# Patient Record
Sex: Female | Born: 1965 | Race: Black or African American | Hispanic: No | Marital: Single | Smoking: Former smoker
Health system: Southern US, Community
[De-identification: ages and names within clinical notes are randomized; demographics above are authoritative.]

## PROBLEM LIST (undated history)

## (undated) DIAGNOSIS — A549 Gonococcal infection, unspecified: Secondary | ICD-10-CM

## (undated) DIAGNOSIS — M1990 Unspecified osteoarthritis, unspecified site: Secondary | ICD-10-CM

## (undated) DIAGNOSIS — K219 Gastro-esophageal reflux disease without esophagitis: Secondary | ICD-10-CM

## (undated) DIAGNOSIS — I10 Essential (primary) hypertension: Secondary | ICD-10-CM

## (undated) DIAGNOSIS — A749 Chlamydial infection, unspecified: Secondary | ICD-10-CM

## (undated) DIAGNOSIS — G629 Polyneuropathy, unspecified: Secondary | ICD-10-CM

## (undated) DIAGNOSIS — N7011 Chronic salpingitis: Secondary | ICD-10-CM

## (undated) DIAGNOSIS — D219 Benign neoplasm of connective and other soft tissue, unspecified: Secondary | ICD-10-CM

## (undated) HISTORY — PX: COSMETIC SURGERY: SHX468

## (undated) HISTORY — PX: BACK SURGERY: SHX140

## (undated) HISTORY — PX: UNILATERAL SALPINGECTOMY: SHX6160

## (undated) HISTORY — PX: OOPHORECTOMY: SHX86

---

## 1998-12-02 ENCOUNTER — Inpatient Hospital Stay (HOSPITAL_COMMUNITY): Admission: AD | Admit: 1998-12-02 | Discharge: 1998-12-02 | Payer: Self-pay | Admitting: Obstetrics & Gynecology

## 1998-12-10 ENCOUNTER — Inpatient Hospital Stay (HOSPITAL_COMMUNITY): Admission: AD | Admit: 1998-12-10 | Discharge: 1998-12-10 | Payer: Self-pay | Admitting: Obstetrics & Gynecology

## 2004-12-25 ENCOUNTER — Emergency Department (HOSPITAL_COMMUNITY): Admission: EM | Admit: 2004-12-25 | Discharge: 2004-12-26 | Payer: Self-pay | Admitting: *Deleted

## 2005-05-24 ENCOUNTER — Emergency Department (HOSPITAL_COMMUNITY): Admission: EM | Admit: 2005-05-24 | Discharge: 2005-05-24 | Payer: Self-pay | Admitting: Emergency Medicine

## 2005-06-26 ENCOUNTER — Emergency Department (HOSPITAL_COMMUNITY): Admission: EM | Admit: 2005-06-26 | Discharge: 2005-06-26 | Payer: Self-pay | Admitting: Emergency Medicine

## 2005-08-15 ENCOUNTER — Emergency Department (HOSPITAL_COMMUNITY): Admission: EM | Admit: 2005-08-15 | Discharge: 2005-08-15 | Payer: Self-pay | Admitting: Emergency Medicine

## 2005-11-05 ENCOUNTER — Emergency Department (HOSPITAL_COMMUNITY): Admission: EM | Admit: 2005-11-05 | Discharge: 2005-11-05 | Payer: Self-pay | Admitting: Emergency Medicine

## 2007-04-12 ENCOUNTER — Emergency Department (HOSPITAL_COMMUNITY): Admission: EM | Admit: 2007-04-12 | Discharge: 2007-04-12 | Payer: Self-pay | Admitting: Emergency Medicine

## 2007-08-13 ENCOUNTER — Ambulatory Visit (HOSPITAL_COMMUNITY): Admission: RE | Admit: 2007-08-13 | Discharge: 2007-08-13 | Payer: Self-pay | Admitting: Family Medicine

## 2008-03-13 HISTORY — PX: HEMATOMA EVACUATION: SHX5118

## 2008-05-30 ENCOUNTER — Emergency Department (HOSPITAL_COMMUNITY): Admission: EM | Admit: 2008-05-30 | Discharge: 2008-05-30 | Payer: Self-pay | Admitting: Emergency Medicine

## 2008-08-21 ENCOUNTER — Emergency Department (HOSPITAL_COMMUNITY): Admission: EM | Admit: 2008-08-21 | Discharge: 2008-08-22 | Payer: Self-pay | Admitting: Emergency Medicine

## 2008-08-31 ENCOUNTER — Emergency Department (HOSPITAL_COMMUNITY): Admission: EM | Admit: 2008-08-31 | Discharge: 2008-08-31 | Payer: Self-pay | Admitting: Emergency Medicine

## 2008-09-22 ENCOUNTER — Emergency Department (HOSPITAL_COMMUNITY): Admission: EM | Admit: 2008-09-22 | Discharge: 2008-09-22 | Payer: Self-pay | Admitting: Emergency Medicine

## 2009-04-21 ENCOUNTER — Emergency Department (HOSPITAL_COMMUNITY): Admission: EM | Admit: 2009-04-21 | Discharge: 2009-04-21 | Payer: Self-pay | Admitting: Emergency Medicine

## 2009-04-28 ENCOUNTER — Emergency Department (HOSPITAL_COMMUNITY): Admission: EM | Admit: 2009-04-28 | Discharge: 2009-04-28 | Payer: Self-pay | Admitting: Emergency Medicine

## 2009-07-13 ENCOUNTER — Emergency Department (HOSPITAL_COMMUNITY): Admission: EM | Admit: 2009-07-13 | Discharge: 2009-07-13 | Payer: Self-pay | Admitting: Emergency Medicine

## 2009-07-23 ENCOUNTER — Ambulatory Visit (HOSPITAL_COMMUNITY): Admission: RE | Admit: 2009-07-23 | Discharge: 2009-07-23 | Payer: Self-pay | Admitting: Family Medicine

## 2009-11-29 ENCOUNTER — Emergency Department (HOSPITAL_COMMUNITY): Admission: EM | Admit: 2009-11-29 | Discharge: 2009-11-29 | Payer: Self-pay | Admitting: Emergency Medicine

## 2010-01-18 ENCOUNTER — Emergency Department (HOSPITAL_COMMUNITY)
Admission: EM | Admit: 2010-01-18 | Discharge: 2010-01-18 | Payer: Self-pay | Source: Home / Self Care | Admitting: Emergency Medicine

## 2010-01-25 ENCOUNTER — Ambulatory Visit (HOSPITAL_COMMUNITY)
Admission: RE | Admit: 2010-01-25 | Discharge: 2010-01-25 | Payer: Self-pay | Source: Home / Self Care | Admitting: Family Medicine

## 2010-06-20 LAB — GC/CHLAMYDIA PROBE AMP, GENITAL
Chlamydia, DNA Probe: NEGATIVE
GC Probe Amp, Genital: NEGATIVE

## 2010-06-20 LAB — URINALYSIS, ROUTINE W REFLEX MICROSCOPIC
Bilirubin Urine: NEGATIVE
Ketones, ur: NEGATIVE mg/dL
Leukocytes, UA: NEGATIVE
Nitrite: NEGATIVE
Protein, ur: NEGATIVE mg/dL

## 2010-06-20 LAB — DIFFERENTIAL
Basophils Absolute: 0 10*3/uL (ref 0.0–0.1)
Eosinophils Relative: 1 % (ref 0–5)
Lymphocytes Relative: 22 % (ref 12–46)
Lymphs Abs: 1.9 10*3/uL (ref 0.7–4.0)
Neutro Abs: 6.2 10*3/uL (ref 1.7–7.7)
Neutrophils Relative %: 70 % (ref 43–77)

## 2010-06-20 LAB — CBC
HCT: 35.4 % — ABNORMAL LOW (ref 36.0–46.0)
Platelets: 208 10*3/uL (ref 150–400)
RDW: 13.3 % (ref 11.5–15.5)
WBC: 8.9 10*3/uL (ref 4.0–10.5)

## 2010-06-20 LAB — BASIC METABOLIC PANEL
BUN: 8 mg/dL (ref 6–23)
Calcium: 8.8 mg/dL (ref 8.4–10.5)
GFR calc non Af Amer: >60 mL/min (ref >60)
Glucose, Bld: 93 mg/dL (ref 70–99)
Potassium: 4.1 mEq/L (ref 3.5–5.1)
Sodium: 133 mEq/L — ABNORMAL LOW (ref 135–145)

## 2010-06-20 LAB — WET PREP, GENITAL
Trich, Wet Prep: NONE SEEN
WBC, Wet Prep HPF POC: NONE SEEN

## 2010-06-20 LAB — PREGNANCY, URINE: Preg Test, Ur: NEGATIVE

## 2010-06-23 LAB — URINALYSIS, ROUTINE W REFLEX MICROSCOPIC
Bilirubin Urine: NEGATIVE
Glucose, UA: NEGATIVE mg/dL
Ketones, ur: NEGATIVE mg/dL
Nitrite: NEGATIVE
pH: 5.5 (ref 5.0–8.0)

## 2010-06-23 LAB — GC/CHLAMYDIA PROBE AMP, GENITAL
Chlamydia, DNA Probe: NEGATIVE
GC Probe Amp, Genital: NEGATIVE

## 2010-06-23 LAB — URINE MICROSCOPIC-ADD ON

## 2010-06-23 LAB — PREGNANCY, URINE: Preg Test, Ur: NEGATIVE

## 2010-06-23 LAB — WET PREP, GENITAL: Yeast Wet Prep HPF POC: NONE SEEN

## 2010-11-02 ENCOUNTER — Other Ambulatory Visit: Payer: Self-pay | Admitting: Obstetrics and Gynecology

## 2010-11-02 DIAGNOSIS — Z139 Encounter for screening, unspecified: Secondary | ICD-10-CM

## 2010-11-08 ENCOUNTER — Ambulatory Visit (HOSPITAL_COMMUNITY)
Admission: RE | Admit: 2010-11-08 | Discharge: 2010-11-08 | Disposition: A | Discharge status: Home / Self Care | Payer: Self-pay | Source: Ambulatory Visit | Attending: Obstetrics and Gynecology | Admitting: Obstetrics and Gynecology

## 2010-11-08 DIAGNOSIS — Z139 Encounter for screening, unspecified: Secondary | ICD-10-CM

## 2011-01-12 ENCOUNTER — Encounter: Payer: Self-pay | Admitting: *Deleted

## 2011-01-12 ENCOUNTER — Emergency Department (HOSPITAL_COMMUNITY)
Admission: EM | Admit: 2011-01-12 | Discharge: 2011-01-13 | Disposition: A | Discharge status: Home / Self Care | Payer: Self-pay | Attending: Emergency Medicine | Admitting: Emergency Medicine

## 2011-01-12 DIAGNOSIS — A599 Trichomoniasis, unspecified: Secondary | ICD-10-CM | POA: Insufficient documentation

## 2011-01-12 DIAGNOSIS — Z87891 Personal history of nicotine dependence: Secondary | ICD-10-CM | POA: Insufficient documentation

## 2011-01-12 DIAGNOSIS — R300 Dysuria: Secondary | ICD-10-CM | POA: Insufficient documentation

## 2011-01-12 DIAGNOSIS — N949 Unspecified condition associated with female genital organs and menstrual cycle: Secondary | ICD-10-CM | POA: Insufficient documentation

## 2011-01-12 DIAGNOSIS — K219 Gastro-esophageal reflux disease without esophagitis: Secondary | ICD-10-CM | POA: Insufficient documentation

## 2011-01-12 DIAGNOSIS — I10 Essential (primary) hypertension: Secondary | ICD-10-CM | POA: Insufficient documentation

## 2011-01-12 DIAGNOSIS — R109 Unspecified abdominal pain: Secondary | ICD-10-CM | POA: Insufficient documentation

## 2011-01-12 HISTORY — DX: Gastro-esophageal reflux disease without esophagitis: K21.9

## 2011-01-12 HISTORY — DX: Essential (primary) hypertension: I10

## 2011-01-12 NOTE — ED Notes (Signed)
Pt reports dysuria and polyuria with asso vag d/c

## 2011-01-13 ENCOUNTER — Other Ambulatory Visit (HOSPITAL_COMMUNITY): Payer: Self-pay

## 2011-01-13 ENCOUNTER — Ambulatory Visit (HOSPITAL_COMMUNITY): Payer: Self-pay

## 2011-01-13 LAB — WET PREP, GENITAL: Yeast Wet Prep HPF POC: NONE SEEN

## 2011-01-13 LAB — URINALYSIS, ROUTINE W REFLEX MICROSCOPIC
Bilirubin Urine: NEGATIVE
Glucose, UA: NEGATIVE mg/dL
Hgb urine dipstick: NEGATIVE
Specific Gravity, Urine: >1.03 — ABNORMAL HIGH (ref 1.005–1.030)
pH: 6 (ref 5.0–8.0)

## 2011-01-13 MED ORDER — AZITHROMYCIN 250 MG PO TABS
1000.0000 mg | ORAL_TABLET | Freq: Once | ORAL | Status: AC
  Administered 2011-01-13: 1000 mg via ORAL
  Filled 2011-01-13: qty 4

## 2011-01-13 MED ORDER — CEFTRIAXONE SODIUM 250 MG IJ SOLR
250.0000 mg | Freq: Once | INTRAMUSCULAR | Status: AC
  Administered 2011-01-13: 250 mg via INTRAMUSCULAR
  Filled 2011-01-13: qty 250

## 2011-01-13 MED ORDER — METRONIDAZOLE 500 MG PO TABS: 500.0000 mg | ORAL_TABLET | Freq: Two times a day (BID) | ORAL | Status: AC

## 2011-01-13 NOTE — ED Provider Notes (Signed)
History     CSN: [id number] Arrival date & time: 01/12/2011 11:23 PM   First MD Initiated Contact with Patient 01/12/11 2355      Chief Complaint  Patient presents with  . Dysuria  . Flank Pain    (Consider location/radiation/quality/duration/timing/severity/associated sxs/prior treatment) Patient is a 45 y.o. female presenting with dysuria and flank pain. The history is provided by the patient.  Dysuria  This is a new problem. The current episode started 2 days ago. The problem occurs every urination. The problem has not changed since onset.The quality of the pain is described as burning. The pain is mild. There has been no fever. She is sexually active. Associated symptoms include chills and discharge. Pertinent negatives include no vomiting, no frequency, no hematuria and no urgency. She has tried nothing for the symptoms. Her past medical history does not include urological procedure. Past medical history comments: She was prescribed a water pill makes her urinate frequently.  Flank Pain Pertinent negatives include no chest pain, no headaches and no shortness of breath.   her flank pain is really left lower pelvic pain, sharp in nature and persisted over last 4 days. It is nonradiating and mild in severity. No aggravating or alleviating factors. No history of same.  Past Medical History  Diagnosis Date  . Hypertension   . Acid reflux     Past Surgical History  Procedure Date  . Oophorectomy     No family history on file.  History  Substance Use Topics  . Smoking status: Former Games developer  . Smokeless tobacco: Not on file  . Alcohol Use: No    OB History    Grav Para Term Preterm Abortions TAB SAB Ect Mult Living                  Review of Systems  Constitutional: Positive for chills. Negative for fever.  HENT: Negative for neck pain and neck stiffness.   Eyes: Negative for pain.  Respiratory: Negative for shortness of breath.   Cardiovascular: Negative for chest  pain.  Gastrointestinal: Negative for vomiting and diarrhea.  Genitourinary: Positive for dysuria. Negative for urgency, frequency and hematuria.  Musculoskeletal: Negative for back pain.  Skin: Negative for rash.  Neurological: Negative for headaches.  All other systems reviewed and are negative.    Allergies  Bee  Home Medications   Current Outpatient Rx  Name Route Sig Dispense Refill  . GABAPENTIN 400 MG PO CAPS Oral Take 400 mg by mouth at bedtime.      Marland Kitchen LISINOPRIL-HYDROCHLOROTHIAZIDE 20-12.5 MG PO TABS Oral Take 1 tablet by mouth daily.      Marland Kitchen OMEPRAZOLE 20 MG PO CPDR Oral Take 20 mg by mouth daily.        BP 128/67  Pulse 66  Temp(Src) 98.4 F (36.9 C) (Oral)  Resp 16  Ht 5\' 6"  (1.676 m)  Wt 300 lb (136.079 kg)  BMI 48.42 kg/m2  SpO2 99%  LMP 12/21/2010  Physical Exam  Constitutional: She is oriented to person, place, and time. She appears well-developed and well-nourished.  HENT:  Head: Normocephalic and atraumatic.  Eyes: Conjunctivae and EOM are normal. Pupils are equal, round, and reactive to light.  Neck: Trachea normal. Neck supple. No thyromegaly present.  Cardiovascular: Normal rate, regular rhythm, S1 normal, S2 normal and normal pulses.     No systolic murmur is present   No diastolic murmur is present  Pulses:      Radial pulses are 2+ on  the right side, and 2+ on the left side.  Pulmonary/Chest: Effort normal and breath sounds normal. She has no wheezes. She has no rhonchi. She has no rales. She exhibits no tenderness.  Abdominal: Soft. Normal appearance and bowel sounds are normal. There is no tenderness. There is no CVA tenderness and negative Murphy's sign.  Genitourinary:       Normal external genitalia. Moderate white vaginal discharge. No cervical motion tenderness. Mild left adnexal tenderness. No rash or lesions  Musculoskeletal:       BLE:s Calves nontender, no cords or erythema, negative Homans sign  Neurological: She is alert and  oriented to person, place, and time. She has normal strength. No cranial nerve deficit or sensory deficit. GCS eye subscore is 4. GCS verbal subscore is 5. GCS motor subscore is 6.  Skin: Skin is warm and dry. No rash noted. She is not diaphoretic.  Psychiatric: Her speech is normal.       Cooperative and appropriate    ED Course  Procedures (including critical care time)  Results for orders placed during the hospital encounter of 01/12/11  URINALYSIS, ROUTINE W REFLEX MICROSCOPIC      Component Value Range   Color, Urine AMBER (*) YELLOW    Appearance CLEAR  CLEAR    Specific Gravity, Urine >1.030 (*) 1.005 - 1.030    pH 6.0  5.0 - 8.0    Glucose, UA NEGATIVE  NEGATIVE (mg/dL)   Hgb urine dipstick NEGATIVE  NEGATIVE    Bilirubin Urine NEGATIVE  NEGATIVE    Ketones, ur TRACE (*) NEGATIVE (mg/dL)   Protein, ur NEGATIVE  NEGATIVE (mg/dL)   Urobilinogen, UA 0.2  0.0 - 1.0 (mg/dL)   Nitrite NEGATIVE  NEGATIVE    Leukocytes, UA NEGATIVE  NEGATIVE   PREGNANCY, URINE      Component Value Range   Preg Test, Ur NEGATIVE    WET PREP, GENITAL      Component Value Range   Yeast, Wet Prep NONE SEEN  NONE SEEN    Trich, Wet Prep FEW (*) NONE SEEN    Clue Cells, Wet Prep MODERATE (*) NONE SEEN    WBC, Wet Prep HPF POC MODERATE (*) NONE SEEN   POCT PREGNANCY, URINE      Component Value Range   Preg Test, Ur NEGATIVE          MDM   Vaginal discharge and left pelvic pain. Patient treated for trichomoniasis and prophylaxis for other STDs with GC chlamydia pending. Given left pelvic pain and no ultrasound available tonight, patient scheduled for ultrasound in the morning. No clinical TOA or torsion/ or indication for transfer for emergent ultrasound at this time.  Patient declines pain meds in the ER is stable for discharge home.         Sunnie Nielsen, MD 01/13/11 [phone]

## 2011-01-13 NOTE — ED Notes (Signed)
Pt left the er stating no needs 

## 2011-01-14 LAB — GC/CHLAMYDIA PROBE AMP, GENITAL
Chlamydia, DNA Probe: NEGATIVE
GC Probe Amp, Genital: NEGATIVE

## 2011-02-07 ENCOUNTER — Encounter (HOSPITAL_COMMUNITY): Payer: Self-pay | Admitting: Emergency Medicine

## 2011-02-07 ENCOUNTER — Emergency Department (HOSPITAL_COMMUNITY)
Admission: EM | Admit: 2011-02-07 | Discharge: 2011-02-07 | Disposition: A | Discharge status: Home / Self Care | Payer: Self-pay | Attending: Emergency Medicine | Admitting: Emergency Medicine

## 2011-02-07 ENCOUNTER — Emergency Department (HOSPITAL_COMMUNITY): Payer: Self-pay

## 2011-02-07 DIAGNOSIS — G589 Mononeuropathy, unspecified: Secondary | ICD-10-CM | POA: Insufficient documentation

## 2011-02-07 DIAGNOSIS — S91309A Unspecified open wound, unspecified foot, initial encounter: Secondary | ICD-10-CM | POA: Insufficient documentation

## 2011-02-07 DIAGNOSIS — W268XXA Contact with other sharp object(s), not elsewhere classified, initial encounter: Secondary | ICD-10-CM | POA: Insufficient documentation

## 2011-02-07 DIAGNOSIS — S91332A Puncture wound without foreign body, left foot, initial encounter: Secondary | ICD-10-CM

## 2011-02-07 DIAGNOSIS — Z23 Encounter for immunization: Secondary | ICD-10-CM | POA: Insufficient documentation

## 2011-02-07 DIAGNOSIS — I10 Essential (primary) hypertension: Secondary | ICD-10-CM | POA: Insufficient documentation

## 2011-02-07 DIAGNOSIS — Z87891 Personal history of nicotine dependence: Secondary | ICD-10-CM | POA: Insufficient documentation

## 2011-02-07 DIAGNOSIS — K219 Gastro-esophageal reflux disease without esophagitis: Secondary | ICD-10-CM | POA: Insufficient documentation

## 2011-02-07 HISTORY — DX: Polyneuropathy, unspecified: G62.9

## 2011-02-07 MED ORDER — DOXYCYCLINE HYCLATE 100 MG PO TABS
100.0000 mg | ORAL_TABLET | Freq: Once | ORAL | Status: AC
  Administered 2011-02-07: 100 mg via ORAL
  Filled 2011-02-07: qty 1

## 2011-02-07 MED ORDER — TETANUS-DIPHTH-ACELL PERTUSSIS 5-2.5-18.5 LF-MCG/0.5 IM SUSP
0.5000 mL | Freq: Once | INTRAMUSCULAR | Status: AC
  Administered 2011-02-07: 0.5 mL via INTRAMUSCULAR
  Filled 2011-02-07 (×3): qty 0.5

## 2011-02-07 MED ORDER — DOXYCYCLINE HYCLATE 100 MG PO CAPS: 100.0000 mg | ORAL_CAPSULE | Freq: Two times a day (BID) | ORAL | Status: AC

## 2011-02-07 NOTE — ED Notes (Signed)
Patient c/o left foot pain. Per patient stepped on broad with nail. Small scabbed puncture wound noted on bottom of foot. No active bleeding. Patient unsure of last tetanus shot.

## 2011-02-07 NOTE — ED Provider Notes (Signed)
History     CSN: [id number] Arrival date & time: 02/07/2011 10:15 AM   First MD Initiated Contact with Patient 02/07/11 1008      Chief Complaint  Patient presents with  . Foot Pain    (Consider location/radiation/quality/duration/timing/severity/associated sxs/prior treatment) HPI Comments: Pt was barefooted at home last PM.  She was building a fire with some scrap wood that had some ocassional protruding nails.  She stepped on one.  Patient is a 45 y.o. female presenting with lower extremity pain. The history is provided by the patient. No language interpreter was used.  Foot Pain This is a new problem. The current episode started yesterday. The problem occurs constantly. The problem has been gradually worsening. Pertinent negatives include no fever. Exacerbated by: palpation. She has tried nothing for the symptoms.    Past Medical History  Diagnosis Date  . Hypertension   . Acid reflux   . Neuropathy     Past Surgical History  Procedure Date  . Oophorectomy     Family History  Problem Relation Age of Onset  . Hypertension Mother   . Diabetes Mother   . Hypertension Father     History  Substance Use Topics  . Smoking status: Former Smoker    Quit date: 02/06/2001  . Smokeless tobacco: Never Used  . Alcohol Use: No    OB History    Grav Para Term Preterm Abortions TAB SAB Ect Mult Living            0      Review of Systems  Constitutional: Negative for fever.  Skin: Positive for wound.  All other systems reviewed and are negative.    Allergies  Bee  Home Medications   Current Outpatient Rx  Name Route Sig Dispense Refill  . GABAPENTIN 400 MG PO CAPS Oral Take 400 mg by mouth at bedtime.      Marland Kitchen LISINOPRIL-HYDROCHLOROTHIAZIDE 20-12.5 MG PO TABS Oral Take 1 tablet by mouth daily.      Marland Kitchen OMEPRAZOLE 20 MG PO CPDR Oral Take 20 mg by mouth daily.        BP 132/83  Pulse 66  Temp(Src) 98.5 F (36.9 C) (Oral)  Resp 15  Ht 5\' 5"  (1.651 m)  Wt 276  lb 8 oz (125.42 kg)  BMI 46.01 kg/m2  SpO2 99%  LMP 12/21/2010  Physical Exam  Nursing note and vitals reviewed. Constitutional: She is oriented to person, place, and time. She appears well-developed and well-nourished. No distress.  HENT:  Head: Normocephalic and atraumatic.  Eyes: EOM are normal.  Neck: Normal range of motion.  Cardiovascular: Normal rate, regular rhythm and normal heart sounds.   Pulmonary/Chest: Effort normal and breath sounds normal.  Abdominal: Soft. She exhibits no distension. There is no tenderness.  Musculoskeletal: Normal range of motion. She exhibits tenderness.       Left foot: She exhibits tenderness. She exhibits normal range of motion, no bony tenderness, no swelling, normal capillary refill, no crepitus, no deformity and no laceration.       Feet:       Closed puncture wound to L mid-arch region.  Neurological: She is alert and oriented to person, place, and time.  Skin: Skin is warm and dry.  Psychiatric: She has a normal mood and affect. Judgment normal.    ED Course  Procedures (including critical care time)  Labs Reviewed - No data to display No results found.   No diagnosis found.    MDM  Worthy Rancher, PA 02/07/11 1040

## 2011-02-07 NOTE — ED Provider Notes (Signed)
Medical screening examination/treatment/procedure(s) were conducted as a shared visit with non-physician practitioner(s) and myself.  I personally evaluated the patient during the encounter  Pt seen and examined.  Still with erythema of the foot however non toxic.  No significant warmth or edema.  No lymphangitic  streaking.  Do not feel admission is necessary at this time.    Celene Kras, MD 02/07/11 [phone]

## 2011-06-20 ENCOUNTER — Encounter (HOSPITAL_COMMUNITY): Payer: Self-pay | Admitting: *Deleted

## 2011-06-20 ENCOUNTER — Emergency Department (HOSPITAL_COMMUNITY)
Admission: EM | Admit: 2011-06-20 | Discharge: 2011-06-21 | Disposition: A | Discharge status: Home / Self Care | Payer: Self-pay | Attending: Emergency Medicine | Admitting: Emergency Medicine

## 2011-06-20 DIAGNOSIS — R05 Cough: Secondary | ICD-10-CM | POA: Insufficient documentation

## 2011-06-20 DIAGNOSIS — J3489 Other specified disorders of nose and nasal sinuses: Secondary | ICD-10-CM | POA: Insufficient documentation

## 2011-06-20 DIAGNOSIS — Z87891 Personal history of nicotine dependence: Secondary | ICD-10-CM | POA: Insufficient documentation

## 2011-06-20 DIAGNOSIS — I10 Essential (primary) hypertension: Secondary | ICD-10-CM | POA: Insufficient documentation

## 2011-06-20 DIAGNOSIS — R059 Cough, unspecified: Secondary | ICD-10-CM | POA: Insufficient documentation

## 2011-06-20 DIAGNOSIS — R6883 Chills (without fever): Secondary | ICD-10-CM | POA: Insufficient documentation

## 2011-06-20 NOTE — ED Notes (Signed)
Cough and congestion since Sat. Before last per pt, today had chills

## 2011-06-20 NOTE — ED Notes (Signed)
Lung sounds clear, no wheezing at present. Pt coughing up yellow tinged sputum now.

## 2011-06-21 ENCOUNTER — Emergency Department (HOSPITAL_COMMUNITY): Payer: Self-pay

## 2011-06-21 MED ORDER — SULFAMETHOXAZOLE-TMP DS 800-160 MG PO TABS
1.0000 | ORAL_TABLET | Freq: Once | ORAL | Status: AC
  Administered 2011-06-21: 1 via ORAL
  Filled 2011-06-21: qty 1

## 2011-06-21 MED ORDER — SULFAMETHOXAZOLE-TRIMETHOPRIM 800-160 MG PO TABS: 1.0000 | ORAL_TABLET | Freq: Two times a day (BID) | ORAL | Status: AC

## 2011-06-21 MED ORDER — ALBUTEROL SULFATE (5 MG/ML) 0.5% IN NEBU
2.5000 mg | INHALATION_SOLUTION | Freq: Once | RESPIRATORY_TRACT | Status: AC
  Administered 2011-06-21: 2.5 mg via RESPIRATORY_TRACT
  Filled 2011-06-21: qty 0.5

## 2011-06-21 MED ORDER — ALBUTEROL SULFATE HFA 108 (90 BASE) MCG/ACT IN AERS: 1.0000 | INHALATION_SPRAY | Freq: Four times a day (QID) | RESPIRATORY_TRACT | Status: DC | PRN

## 2011-06-21 MED ORDER — SULFAMETHOXAZOLE-TRIMETHOPRIM 200-40 MG/5ML PO SUSP: 20.0000 mL | Freq: Once | ORAL | Status: DC

## 2011-06-21 NOTE — Discharge Instructions (Signed)
Drink lots of fluids. Use tylenol or ibuprofen for aches, pains and any fevers. Take allof the antibiotic. Use the inhaler for wheezing.   Cough, Adult  A cough is a reflex. It helps you clear your throat and airways. A cough can help heal your body. A cough can last 2 or 3 weeks (acute) or may last more than 8 weeks (chronic). Some common causes of a cough can include an infection, allergy, or a cold. HOME CARE  Only take medicine as told by your doctor.   If given, take your medicines (antibiotics) as told. Finish them even if you start to feel better.   Use a cold steam vaporizer or humidier in your home. This can help loosen thick spit (secretions).   Sleep so you are almost sitting up (semi-upright). Use pillows to do this. This helps reduce coughing.   Rest as needed.   Stop smoking if you smoke.  GET HELP RIGHT AWAY IF:  You have yellowish-white fluid (pus) in your thick spit.   Your cough gets worse.   Your medicine does not reduce coughing, and you are losing sleep.   You cough up blood.   You have trouble breathing.   Your pain gets worse and medicine does not help.   You have a fever.  MAKE SURE YOU:   Understand these instructions.   Will watch your condition.   Will get help right away if you are not doing well or get worse.  Document Released: 11/10/2010 Document Revised: 02/16/2011 Document Reviewed: 11/10/2010 [facility] Patient Information 2012 [place], [place].

## 2011-06-21 NOTE — ED Notes (Signed)
RT at bedside.

## 2011-06-28 NOTE — ED Provider Notes (Signed)
History     CSN: [id number]  Arrival date & time 06/20/11  7829   First MD Initiated Contact with Patient 06/20/11 2349      Chief Complaint  Patient presents with  . Cough  . Nasal Congestion  . Chills    (Consider location/radiation/quality/duration/timing/severity/associated sxs/prior treatment) HPI Diana Blevins is a 46 y.o. female who presents to the Emergency Department complaining of  Cough, congestion, chills, wheezing since Saturday. Use OTC cough medicine without relief.Cough is associated with discomfort to the chest. Denies fever, shortness of breath. Past Medical History  Diagnosis Date  . Hypertension   . Acid reflux   . Neuropathy     Past Surgical History  Procedure Date  . Oophorectomy     Family History  Problem Relation Age of Onset  . Hypertension Mother   . Diabetes Mother   . Hypertension Father     History  Substance Use Topics  . Smoking status: Former Smoker    Quit date: 02/06/2001  . Smokeless tobacco: Never Used  . Alcohol Use: No    OB History    Grav Para Term Preterm Abortions TAB SAB Ect Mult Living            0      Review of Systems  Constitutional: Positive for chills. Negative for fever.       10 Systems reviewed and are negative for acute change except as noted in the HPI.  HENT: Positive for congestion.   Eyes: Negative for discharge and redness.  Respiratory: Positive for cough. Negative for shortness of breath.   Cardiovascular: Negative for chest pain.  Gastrointestinal: Negative for vomiting and abdominal pain.  Musculoskeletal: Negative for back pain.  Skin: Negative for rash.  Neurological: Negative for syncope, numbness and headaches.  Psychiatric/Behavioral:       No behavior change.    Allergies  Bee; Dust mite extract; and Latex  Home Medications   Current Outpatient Rx  Name Route Sig Dispense Refill  . LISINOPRIL-HYDROCHLOROTHIAZIDE 20-12.5 MG PO TABS Oral Take 1 tablet by mouth daily.       Marland Kitchen LORATADINE 10 MG PO TABS Oral Take 10 mg by mouth daily as needed. FOR ALLERGIES    . ALBUTEROL SULFATE HFA 108 (90 BASE) MCG/ACT IN AERS Inhalation Inhale 1-2 puffs into the lungs every 6 (six) hours as needed for wheezing. 1 Inhaler 0  . SULFAMETHOXAZOLE-TRIMETHOPRIM 800-160 MG PO TABS Oral Take 1 tablet by mouth 2 (two) times daily. 14 tablet 0    BP 126/73  Pulse 78  Temp(Src) 98.9 F (37.2 C) (Oral)  Resp 20  Ht 5\' 5"  (1.651 m)  Wt 276 lb (125.193 kg)  BMI 45.93 kg/m2  SpO2 98%  LMP 06/11/2011  Physical Exam  Nursing note and vitals reviewed. Constitutional:       Awake, alert, nontoxic appearance.  HENT:  Head: Atraumatic.  Eyes: Right eye exhibits no discharge. Left eye exhibits no discharge.  Neck: Neck supple.  Pulmonary/Chest: Effort normal and breath sounds normal. No respiratory distress. She has no wheezes. She exhibits no tenderness.       coughing  Abdominal: Soft. There is no tenderness. There is no rebound.  Musculoskeletal: She exhibits no tenderness.       Baseline ROM, no obvious new focal weakness.  Neurological: She is alert.       Mental status and motor strength appears baseline for patient and situation.  Skin: No rash noted.  Psychiatric: She has  a normal mood and affect.    ED Course  Procedures (including critical care time)  Dg Chest 2 View  06/21/2011  *RADIOLOGY REPORT*  Clinical Data: Cough and congestion with chills.  High blood pressure.  Nonsmoker.  CHEST - 2 VIEW  Comparison: 04/28/2009.  Findings: No infiltrate, congestive heart failure or pneumothorax. Question tiny granuloma left upper lobe.  Appearance unchanged.  Heart size within normal limits.  IMPRESSION: No acute abnormality.  Original Report Authenticated By: Fuller Canada, M.D.   1. Cough       MDM  Patient with cough and congestion since Saturday. Given nebulizer treatment with improvement. Xray was unremarkable. Initiated antibiotic threapy.  Pt feels improved after  observation and/or treatment in ED.Pt stable in ED with no significant deterioration in condition.The patient appears reasonably screened and/or stabilized for discharge and I doubt any other medical condition or other [facility] requiring further screening, evaluation, or treatment in the ED at this time prior to discharge.  MDM Reviewed: nursing note and vitals Interpretation: x-ray           Nicoletta Dress. Colon Branch, MD 06/28/11 1046

## 2011-09-13 ENCOUNTER — Emergency Department (HOSPITAL_COMMUNITY)
Admission: EM | Admit: 2011-09-13 | Discharge: 2011-09-13 | Disposition: A | Discharge status: Home / Self Care | Payer: Self-pay | Attending: Emergency Medicine | Admitting: Emergency Medicine

## 2011-09-13 ENCOUNTER — Encounter (HOSPITAL_COMMUNITY): Payer: Self-pay | Admitting: *Deleted

## 2011-09-13 DIAGNOSIS — I10 Essential (primary) hypertension: Secondary | ICD-10-CM | POA: Insufficient documentation

## 2011-09-13 DIAGNOSIS — L02419 Cutaneous abscess of limb, unspecified: Secondary | ICD-10-CM | POA: Insufficient documentation

## 2011-09-13 DIAGNOSIS — L0390 Cellulitis, unspecified: Secondary | ICD-10-CM

## 2011-09-13 DIAGNOSIS — K219 Gastro-esophageal reflux disease without esophagitis: Secondary | ICD-10-CM | POA: Insufficient documentation

## 2011-09-13 DIAGNOSIS — Z87891 Personal history of nicotine dependence: Secondary | ICD-10-CM | POA: Insufficient documentation

## 2011-09-13 MED ORDER — IBUPROFEN 800 MG PO TABS
800.0000 mg | ORAL_TABLET | Freq: Once | ORAL | Status: AC
  Administered 2011-09-13: 800 mg via ORAL
  Filled 2011-09-13: qty 1

## 2011-09-13 MED ORDER — HYDROCODONE-ACETAMINOPHEN 5-325 MG PO TABS: ORAL_TABLET | ORAL | Status: AC

## 2011-09-13 MED ORDER — SULFAMETHOXAZOLE-TMP DS 800-160 MG PO TABS
1.0000 | ORAL_TABLET | Freq: Once | ORAL | Status: AC
  Administered 2011-09-13: 1 via ORAL
  Filled 2011-09-13: qty 1

## 2011-09-13 MED ORDER — SULFAMETHOXAZOLE-TRIMETHOPRIM 800-160 MG PO TABS: 1.0000 | ORAL_TABLET | Freq: Two times a day (BID) | ORAL | Status: AC

## 2011-09-13 NOTE — ED Notes (Signed)
Pain, redness, swelling to rt knee. Thinks insect bit her on Sunday,

## 2011-09-13 NOTE — ED Provider Notes (Signed)
History     CSN: [id number]  Arrival date & time 09/13/11  1742   First MD Initiated Contact with Patient 09/13/11 1805      Chief Complaint  Patient presents with  . Insect Bite    (Consider location/radiation/quality/duration/timing/severity/associated sxs/prior treatment) Patient is a 46 y.o. female presenting with rash. The history is provided by the patient.  Rash  This is a new problem. The current episode started more than 2 days ago. The problem has been gradually worsening. The problem is associated with a spider bite and an insect bite/sting. There has been no fever. Affected Location: right knee. The pain is moderate. The pain has been constant since onset. Associated symptoms include pain. Pertinent negatives include no blisters, no itching and no weeping. She has tried nothing for the symptoms. The treatment provided no relief.    Past Medical History  Diagnosis Date  . Hypertension   . Acid reflux   . Neuropathy     Past Surgical History  Procedure Date  . Oophorectomy     Family History  Problem Relation Age of Onset  . Hypertension Mother   . Diabetes Mother   . Hypertension Father     History  Substance Use Topics  . Smoking status: Former Smoker    Quit date: 02/06/2001  . Smokeless tobacco: Never Used  . Alcohol Use: No    OB History    Grav Para Term Preterm Abortions TAB SAB Ect Mult Living            0      Review of Systems  Constitutional: Negative for fever and chills.  Gastrointestinal: Negative for nausea and vomiting.  Musculoskeletal: Negative for joint swelling and arthralgias.  Skin: Positive for color change and wound. Negative for itching and rash.       Abscess   Hematological: Negative for adenopathy.  All other systems reviewed and are negative.    Allergies  Nutritional supplements; Dust mite extract; and Latex  Home Medications   Current Outpatient Rx  Name Route Sig Dispense Refill  . ALBUTEROL SULFATE HFA  108 (90 BASE) MCG/ACT IN AERS Inhalation Inhale 1-2 puffs into the lungs every 6 (six) hours as needed for wheezing. 1 Inhaler 0  . LISINOPRIL-HYDROCHLOROTHIAZIDE 20-12.5 MG PO TABS Oral Take 1 tablet by mouth daily.      Marland Kitchen LORATADINE 10 MG PO TABS Oral Take 10 mg by mouth daily as needed. FOR ALLERGIES      BP 145/72  Pulse 72  Temp 98.5 F (36.9 C) (Oral)  Resp 20  Ht 5\' 5"  (1.651 m)  Wt 267 lb (121.11 kg)  BMI 44.43 kg/m2  SpO2 100%  LMP 09/06/2011  Physical Exam  Nursing note and vitals reviewed. Constitutional: She is oriented to person, place, and time. She appears well-developed and well-nourished. No distress.  HENT:  Head: Normocephalic and atraumatic.  Cardiovascular: Normal rate, regular rhythm and normal heart sounds.   Pulmonary/Chest: Effort normal and breath sounds normal.  Neurological: She is alert and oriented to person, place, and time. She exhibits normal muscle tone. Coordination normal.  Skin: Skin is warm. There is erythema.          lesion to the popliteal fossa of the right knee.  Moderate STS and surrounding erythema    ED Course  Procedures (including critical care time)  Labs Reviewed - No data to display      MDM    large area of erythema to the  posterior right leg. Area of induration to the right popliteal fossa. Likely early abscess versus infected insect bite. I have marked the leading edge of the erythema. I will start patient on antibiotics she agrees to close followup here in 1-2 days.  Vitals are stable.  Patient ambulates well. Non-toxic appearing.  Patient / Family / Caregiver understand and agree with initial ED impression and plan with expectations set for ED visit. Pt stable in ED with no significant deterioration in condition. Pt feels improved after observation and/or treatment in ED.      Prescribed norco #[id number] Bactrim DS  Shaun Runyon L. [place], [place] 09/18/11 2236

## 2011-09-13 NOTE — ED Notes (Signed)
Pt c/o itching, pain, swelling to posterior right knee area, started a few days ago, pt states that she may have been ?bitten by insect, unsure,  Area behind right knee is red, swollen,

## 2011-09-14 ENCOUNTER — Encounter (HOSPITAL_COMMUNITY): Payer: Self-pay | Admitting: *Deleted

## 2011-09-14 ENCOUNTER — Emergency Department (HOSPITAL_COMMUNITY)
Admission: EM | Admit: 2011-09-14 | Discharge: 2011-09-14 | Disposition: A | Discharge status: Home / Self Care | Payer: Self-pay | Attending: Emergency Medicine | Admitting: Emergency Medicine

## 2011-09-14 DIAGNOSIS — L03119 Cellulitis of unspecified part of limb: Secondary | ICD-10-CM | POA: Insufficient documentation

## 2011-09-14 DIAGNOSIS — L0291 Cutaneous abscess, unspecified: Secondary | ICD-10-CM

## 2011-09-14 DIAGNOSIS — K219 Gastro-esophageal reflux disease without esophagitis: Secondary | ICD-10-CM | POA: Insufficient documentation

## 2011-09-14 DIAGNOSIS — I10 Essential (primary) hypertension: Secondary | ICD-10-CM | POA: Insufficient documentation

## 2011-09-14 DIAGNOSIS — L02419 Cutaneous abscess of limb, unspecified: Secondary | ICD-10-CM | POA: Insufficient documentation

## 2011-09-14 DIAGNOSIS — Z87891 Personal history of nicotine dependence: Secondary | ICD-10-CM | POA: Insufficient documentation

## 2011-09-14 MED ORDER — VANCOMYCIN HCL IN DEXTROSE 1-5 GM/200ML-% IV SOLN
1000.0000 mg | Freq: Once | INTRAVENOUS | Status: AC
  Administered 2011-09-14: 1000 mg via INTRAVENOUS
  Filled 2011-09-14: qty 200

## 2011-09-14 MED ORDER — KETOROLAC TROMETHAMINE 30 MG/ML IJ SOLN
30.0000 mg | Freq: Once | INTRAMUSCULAR | Status: AC
  Administered 2011-09-14: 30 mg via INTRAVENOUS
  Filled 2011-09-14: qty 1

## 2011-09-14 MED ORDER — ONDANSETRON HCL 4 MG/2ML IJ SOLN
4.0000 mg | Freq: Once | INTRAMUSCULAR | Status: AC
  Administered 2011-09-14: 4 mg via INTRAVENOUS
  Filled 2011-09-14: qty 2

## 2011-09-14 MED ORDER — LIDOCAINE HCL (PF) 1 % IJ SOLN
INTRAMUSCULAR | Status: AC
  Administered 2011-09-14: 5 mL
  Filled 2011-09-14: qty 5

## 2011-09-14 NOTE — ED Notes (Addendum)
Pt here for recheck on ?inscet bite on right knee, was seen here yesterday, advised to return for recheck.  pt c/o continued redness, pt states that she thinks that she may have been stung by a bee originally, pt states that she has been working and has not been able to take her pain medication today

## 2011-09-14 NOTE — ED Provider Notes (Signed)
History     CSN: [id number]  Arrival date & time 09/14/11  1519   First MD Initiated Contact with Patient 09/14/11 1541      Chief Complaint  Patient presents with  . Wound Check    (Consider location/radiation/quality/duration/timing/severity/associated sxs/prior treatment) HPI Comments: Patient was seen here yesterday for a possible insect bite to her right posterior knee.  She returned here today c/o worsening pain and redness to the same area.  States she took her antibiotic this morning but has not taken her pain medication.  Pain to her leg is worse with weight bearing.  She denies numbness or weakness of your leg.    Patient is a 46 y.o. female presenting with wound check.  Wound Check  She was treated in the ED yesterday. Prior ED Treatment: Check of skin infection. Treatments since wound repair include oral antibiotics. Fever duration: No fever. There has been no drainage from the wound. The redness has improved. The swelling has not changed. The pain has worsened. There is difficulty moving the extremity or digit due to pain.    Past Medical History  Diagnosis Date  . Hypertension   . Acid reflux   . Neuropathy     Past Surgical History  Procedure Date  . Oophorectomy     Family History  Problem Relation Age of Onset  . Hypertension Mother   . Diabetes Mother   . Hypertension Father     History  Substance Use Topics  . Smoking status: Former Smoker    Quit date: 02/06/2001  . Smokeless tobacco: Never Used  . Alcohol Use: No    OB History    Grav Para Term Preterm Abortions TAB SAB Ect Mult Living            0      Review of Systems  Constitutional: Negative for fever and chills.  Gastrointestinal: Negative for nausea and vomiting.  Genitourinary: Negative for dysuria and difficulty urinating.  Musculoskeletal: Positive for joint swelling and arthralgias. Negative for back pain.  Skin: Positive for color change and wound.  Neurological: Negative  for weakness and numbness.  All other systems reviewed and are negative.    Allergies  Nutritional supplements; Dust mite extract; and Latex  Home Medications   Current Outpatient Rx  Name Route Sig Dispense Refill  . ALBUTEROL SULFATE HFA 108 (90 BASE) MCG/ACT IN AERS Inhalation Inhale 1-2 puffs into the lungs every 6 (six) hours as needed for wheezing. 1 Inhaler 0  . HYDROCODONE-ACETAMINOPHEN 5-325 MG PO TABS  Take one-two tabs po q 4-6 hrs prn pain 24 tablet 0  . LISINOPRIL-HYDROCHLOROTHIAZIDE 20-12.5 MG PO TABS Oral Take 1 tablet by mouth daily.      Marland Kitchen LORATADINE 10 MG PO TABS Oral Take 10 mg by mouth daily as needed. FOR ALLERGIES    . SULFAMETHOXAZOLE-TRIMETHOPRIM 800-160 MG PO TABS Oral Take 1 tablet by mouth 2 (two) times daily. For 10 days 20 tablet 0    BP 155/83  Pulse 82  Temp 98.7 F (37.1 C)  Resp 20  SpO2 99%  LMP 09/06/2011  Physical Exam  Nursing note and vitals reviewed. Constitutional: She is oriented to person, place, and time. She appears well-developed and well-nourished. No distress.  HENT:  Head: Normocephalic and atraumatic.  Cardiovascular: Normal rate, regular rhythm, normal heart sounds and intact distal pulses.   No murmur heard. Pulmonary/Chest: Effort normal and breath sounds normal.  Musculoskeletal: She exhibits edema and tenderness.  Right knee: She exhibits swelling and erythema.       Legs:      Moderate ttp and induration to the popliteal fossa of the right knee.  Erythema was marked on previous visit, does not extend beyond the margins.  Small pustule has formed within the area  Neurological: She is alert and oriented to person, place, and time. She exhibits normal muscle tone. Coordination normal.  Skin: Skin is warm and dry. There is erythema.       See MS exam    ED Course  Procedures (including critical care time)  Labs Reviewed - No data to display      MDM   INCISION AND DRAINAGE Performed by: Diana Blevins. Consent: Verbal consent obtained. Risks and benefits: risks, benefits and alternatives were discussed Type: abscess  Body area: left popliteal fossa Anesthesia: local infiltration  Local anesthetic: lidocaine 1% w/o epinephrine  Anesthetic total: 4ml  Complexity: complex Blunt dissection to break up loculations  Drainage: purulent  Drainage amount: moderate Packing material: 1/4 in iodoform gauze  Patient tolerance: Patient tolerated the procedure well with no immediate complications.      Previous medical charts were reviewed, the nursing notes and vitals signs from this visit were also reviewed by me   All laboratory results and/or imaging results performed on this visit, if applicable, were reviewed by me and discussed with the patient and/or parent as well as recommendation for follow-up    MEDICATIONS GIVEN IN ED: toradol, zofran and vancomycin    PRESCRIPTIONS GIVEN AT DISCHARGE:  Patient has bactrim and norco prescribed on yesterday's visit   Pt stable in ED with no significant deterioration in condition. Pt feels improved after observation and/or treatment in ED. Patient / Family / Caregiver understand and agree with initial ED impression and plan with expectations set for ED visit.  Patient agrees to return to ED for any worsening symptoms   The patient appears reasonably screened and/or stabilized for discharge and I doubt any other medical condition or other [facility] requiring further screening, evaluation, or treatment in the ED at this time prior to discharge.     Patient agrees to return here in 2 days for another recheck and packing removal.       Diana Blevins Diana Blevins, [place] 09/14/11 1827

## 2011-09-14 NOTE — ED Provider Notes (Signed)
Medical screening examination/treatment/procedure(s) were performed by non-physician practitioner and as supervising physician I was immediately available for consultation/collaboration.  Shelda Jakes, MD 09/14/11 [phone]

## 2011-09-16 ENCOUNTER — Encounter (HOSPITAL_COMMUNITY): Payer: Self-pay | Admitting: *Deleted

## 2011-09-16 ENCOUNTER — Emergency Department (HOSPITAL_COMMUNITY)
Admission: EM | Admit: 2011-09-16 | Discharge: 2011-09-16 | Disposition: A | Discharge status: Home / Self Care | Payer: Self-pay | Attending: Emergency Medicine | Admitting: Emergency Medicine

## 2011-09-16 DIAGNOSIS — Z4801 Encounter for change or removal of surgical wound dressing: Secondary | ICD-10-CM | POA: Insufficient documentation

## 2011-09-16 DIAGNOSIS — Z87891 Personal history of nicotine dependence: Secondary | ICD-10-CM | POA: Insufficient documentation

## 2011-09-16 DIAGNOSIS — K219 Gastro-esophageal reflux disease without esophagitis: Secondary | ICD-10-CM | POA: Insufficient documentation

## 2011-09-16 DIAGNOSIS — Z5189 Encounter for other specified aftercare: Secondary | ICD-10-CM

## 2011-09-16 DIAGNOSIS — I10 Essential (primary) hypertension: Secondary | ICD-10-CM | POA: Insufficient documentation

## 2011-09-16 NOTE — Discharge Instructions (Signed)
Heat Therapy Your caregiver advises heat therapy for your condition. Heat applications help reduce pain and muscle spasm around injuries or areas of inflammation. They also increase blood flow to the area which can speed healing. Moist heat is commonly used to help heal skin infections. Heat treatments should be used for about 30-40 minutes every 2-4 hours. Shorter treatments should be used if there is discomfort. Different forms of heat therapy are:  Warm water - Use a basin or tub filled with heated water; change it often to keep the water hot. The water temperature should not be uncomfortable to the skin.   Hot packs - Use several bath towels soaked in hot water and lightly wrung out. These should be changed every 5-10 minutes. You can buy commercially-available packs that provide more sustained heat. Hot water bottles are not recommended because they give only a small amount of heat.   Electric heating pads - These may be used for dry heat only. Do not use wet material around a regular heating pad because of the risk of electrical shock. Do not leave heating pads on for long periods as they can burn the skin or cause permanent discoloration. Do not lie on top of a heating pad because, again, this can cause a burn.   Heat lamps - Use an infrared light. Keep the bulb 15-25 inches from the skin. Watch for signs of excessive heat (blotchy areas will appear).  Be cautious with heat therapy to avoid burning the skin. You should not use heat therapy without careful medical supervision if you have: circulation problems, numbness or unusual swelling in the area to be treated. Document Released: 02/27/2005 Document Revised: 02/16/2011 Document Reviewed: 08/25/2006 [facility] Patient Information 2012 [place], [place].Abscess An abscess (boil or furuncle) is an infected area that contains a collection of pus.  SYMPTOMS Signs and symptoms of an abscess include pain, tenderness, redness, or hardness. You may feel  a moveable soft area under your skin. An abscess can occur anywhere in the body.  TREATMENT  A surgical cut (incision) may be made over your abscess to drain the pus. Gauze may be packed into the space or a drain may be looped through the abscess cavity (pocket). This provides a drain that will allow the cavity to heal from the inside outwards. The abscess may be painful for a few days, but should feel much better if it was drained.  Your abscess, if seen early, may not have localized and may not have been drained. If not, another appointment may be required if it does not get better on its own or with medications. HOME CARE INSTRUCTIONS   Only take over-the-counter or prescription medicines for pain, discomfort, or fever as directed by your caregiver.   Take your antibiotics as directed if they were prescribed. Finish them even if you start to feel better.   Keep the skin and clothes clean around your abscess.   If the abscess was drained, you will need to use gauze dressing to collect any draining pus. Dressings will typically need to be changed 3 or more times a day.   The infection may spread by skin contact with others. Avoid skin contact as much as possible.   Practice good hygiene. This includes regular hand washing, cover any draining skin lesions, and do not share personal care items.   If you participate in sports, do not share athletic equipment, towels, whirlpools, or personal care items. Shower after every practice or tournament.   If a  draining area cannot be adequately covered:   Do not participate in sports.   Children should not participate in day care until the wound has healed or drainage stops.   If your caregiver has given you a follow-up appointment, it is very important to keep that appointment. Not keeping the appointment could result in a much worse infection, chronic or permanent injury, pain, and disability. If there is any problem keeping the appointment, you  must call back to this facility for assistance.  SEEK MEDICAL CARE IF:   You develop increased pain, swelling, redness, drainage, or bleeding in the wound site.   You develop signs of generalized infection including muscle aches, chills, fever, or a general ill feeling.   You have an oral temperature above 102 F (38.9 C).  MAKE SURE YOU:   Understand these instructions.   Will watch your condition.   Will get help right away if you are not doing well or get worse.  Document Released: 12/07/2004 Document Revised: 02/16/2011 Document Reviewed: 10/01/2007 [facility] Patient Information 2012 [place], [place].   Continue taking the antibiotic.  Apply warm compresses several times daily.  Remove about 1 inch of packing daily until gone with the goal of removing all of it within 5-6 days (fom the day of incision).  Return as needed.

## 2011-09-16 NOTE — ED Notes (Signed)
Area redressed. nad

## 2011-09-16 NOTE — ED Notes (Signed)
Pt recently had I& D. Here for recheck. NAD.

## 2011-09-16 NOTE — ED Provider Notes (Signed)
History     CSN: [id number]  Arrival date & time 09/16/11  0916   First MD Initiated Contact with Patient 09/16/11 [phone]      Chief Complaint  Patient presents with  . Wound Check    (Consider location/radiation/quality/duration/timing/severity/associated sxs/prior treatment) HPI Comments: Pt states the area feels much better than it did several days ago.  Taking bactrim DS and pain medicine.  Patient is a 46 y.o. female presenting with wound check. The history is provided by the patient. No language interpreter was used.  Wound Check  Treated in ED: 2 days ago.  I&D of R popliteal abscess. Previous treatment in the ED includes I&D of abscess. Treatments since wound repair include oral antibiotics. Her temperature was unmeasured prior to arrival. There has been colored discharge from the wound. The redness has improved. The swelling has improved. The pain has improved. There is difficulty moving the extremity or digit due to pain.    Past Medical History  Diagnosis Date  . Hypertension   . Acid reflux   . Neuropathy     Past Surgical History  Procedure Date  . Oophorectomy     Family History  Problem Relation Age of Onset  . Hypertension Mother   . Diabetes Mother   . Hypertension Father     History  Substance Use Topics  . Smoking status: Former Smoker    Quit date: 02/06/2001  . Smokeless tobacco: Never Used  . Alcohol Use: No    OB History    Grav Para Term Preterm Abortions TAB SAB Ect Mult Living            0      Review of Systems  Constitutional: Negative for fever and chills.  Skin: Positive for wound.       Abscess with incision.  All other systems reviewed and are negative.    Allergies  Nutritional supplements; Dust mite extract; and Latex  Home Medications   Current Outpatient Rx  Name Route Sig Dispense Refill  . ALBUTEROL SULFATE HFA 108 (90 BASE) MCG/ACT IN AERS Inhalation Inhale 1-2 puffs into the lungs every 6 (six) hours as needed  for wheezing. 1 Inhaler 0  . HYDROCODONE-ACETAMINOPHEN 5-325 MG PO TABS  Take one-two tabs po q 4-6 hrs prn pain 24 tablet 0  . LISINOPRIL-HYDROCHLOROTHIAZIDE 20-12.5 MG PO TABS Oral Take 1 tablet by mouth daily.      Marland Kitchen LORATADINE 10 MG PO TABS Oral Take 10 mg by mouth daily as needed. FOR ALLERGIES    . SULFAMETHOXAZOLE-TRIMETHOPRIM 800-160 MG PO TABS Oral Take 1 tablet by mouth 2 (two) times daily. For 10 days 20 tablet 0    BP 128/89  Pulse 73  Temp 98.2 F (36.8 C) (Oral)  Resp 16  SpO2 99%  LMP 09/06/2011  Physical Exam  Nursing note and vitals reviewed. Constitutional: She is oriented to person, place, and time. She appears well-developed and well-nourished. No distress.  HENT:  Head: Normocephalic and atraumatic.  Eyes: EOM are normal.  Neck: Normal range of motion.  Cardiovascular: Normal rate, regular rhythm and normal heart sounds.   Pulmonary/Chest: Effort normal and breath sounds normal.  Abdominal: Soft. She exhibits no distension. There is no tenderness.  Musculoskeletal: She exhibits tenderness.       Right knee: She exhibits decreased range of motion and swelling. She exhibits no bony tenderness. tenderness found.       Legs: Neurological: She is alert and oriented to person, place, and  time.  Skin: Skin is warm and dry.  Psychiatric: She has a normal mood and affect. Judgment normal.    ED Course  Procedures (including critical care time)  Labs Reviewed - No data to display No results found. 4 inches of iodoform gauze cut.    No diagnosis found.    MDM  Pt feels that pain, swelling and redness have improved.  She has been instructed to remove ~ 1 inch of packing daily until gone(within 5-6 days post I&D.  Pt understands and agrees.  Continue warm compresses and abx.        Evalina Field, [place] 09/16/11 [phone]

## 2011-09-16 NOTE — ED Provider Notes (Signed)
Medical screening examination/treatment/procedure(s) were performed by non-physician practitioner and as supervising physician I was immediately available for consultation/collaboration.   Joya Gaskins, MD 09/16/11 1017

## 2011-09-21 NOTE — ED Provider Notes (Signed)
Medical screening examination/treatment/procedure(s) were performed by non-physician practitioner and as supervising physician I was immediately available for consultation/collaboration.   Shelda Jakes, MD 09/21/11 1304

## 2011-10-26 ENCOUNTER — Encounter (HOSPITAL_COMMUNITY): Payer: Self-pay | Admitting: *Deleted

## 2011-10-26 ENCOUNTER — Emergency Department (HOSPITAL_COMMUNITY)
Admission: EM | Admit: 2011-10-26 | Discharge: 2011-10-26 | Disposition: A | Discharge status: Home / Self Care | Payer: Self-pay | Attending: Emergency Medicine | Admitting: Emergency Medicine

## 2011-10-26 DIAGNOSIS — I10 Essential (primary) hypertension: Secondary | ICD-10-CM | POA: Insufficient documentation

## 2011-10-26 DIAGNOSIS — Z79899 Other long term (current) drug therapy: Secondary | ICD-10-CM | POA: Insufficient documentation

## 2011-10-26 DIAGNOSIS — H109 Unspecified conjunctivitis: Secondary | ICD-10-CM

## 2011-10-26 DIAGNOSIS — Z91040 Latex allergy status: Secondary | ICD-10-CM | POA: Insufficient documentation

## 2011-10-26 DIAGNOSIS — Z91038 Other insect allergy status: Secondary | ICD-10-CM | POA: Insufficient documentation

## 2011-10-26 MED ORDER — TOBRAMYCIN 0.3 % OP SOLN
2.0000 [drp] | Freq: Once | OPHTHALMIC | Status: AC
  Administered 2011-10-26: 2 [drp] via OPHTHALMIC
  Filled 2011-10-26: qty 5

## 2011-10-26 NOTE — ED Provider Notes (Signed)
Medical screening examination/treatment/procedure(s) were performed by non-physician practitioner and as supervising physician I was immediately available for consultation/collaboration  Devoria Albe, MD, Franz Dell, MD 10/26/11 2216

## 2011-10-26 NOTE — ED Notes (Signed)
Pt c/o irritation and drainage in both eyes x4-5 days. Pt states when she wakes up in the morning there is "gunk" in her eyes. Pt also states both eyes "drain during the day". No redness noted on assessment.

## 2011-10-26 NOTE — ED Provider Notes (Signed)
History     CSN: [id number]  Arrival date & time 10/26/11  5621   First MD Initiated Contact with Patient 10/26/11 2058      Chief Complaint  Patient presents with  . Conjunctivitis    (Consider location/radiation/quality/duration/timing/severity/associated sxs/prior treatment) HPI Comments: Around a friend that was dx with conjunctivitis ~ 5 days ago which is when her sxs began.  L eye matted shut in AM.  No fever.  + rhinorrhea.  The history is provided by the patient. No language interpreter was used.    Past Medical History  Diagnosis Date  . Hypertension   . Acid reflux   . Neuropathy     Past Surgical History  Procedure Date  . Oophorectomy     Family History  Problem Relation Age of Onset  . Hypertension Mother   . Diabetes Mother   . Hypertension Father     History  Substance Use Topics  . Smoking status: Former Smoker    Quit date: 02/06/2001  . Smokeless tobacco: Never Used  . Alcohol Use: No    OB History    Grav Para Term Preterm Abortions TAB SAB Ect Mult Living            0      Review of Systems  Constitutional: Negative for fever and chills.  HENT: Positive for rhinorrhea.   Eyes: Positive for discharge, redness and itching. Negative for photophobia and pain.  Respiratory: Negative for cough.   All other systems reviewed and are negative.    Allergies  Bee venom; Dust mite extract; and Latex  Home Medications   Current Outpatient Rx  Name Route Sig Dispense Refill  . LISINOPRIL-HYDROCHLOROTHIAZIDE 20-12.5 MG PO TABS Oral Take 1 tablet by mouth daily.     Marland Kitchen LORATADINE 10 MG PO TABS Oral Take 10 mg by mouth daily as needed. FOR ALLERGIES      BP 155/84  Pulse 70  Temp 98.3 F (36.8 C) (Oral)  Resp 18  Ht 5\' 5"  (1.651 m)  Wt 280 lb (127.007 kg)  BMI 46.59 kg/m2  SpO2 100%  LMP 10/12/2011  Physical Exam  Nursing note and vitals reviewed. Constitutional: She is oriented to person, place, and time. She appears  well-developed and well-nourished. No distress.  HENT:  Head: Normocephalic and atraumatic.  Eyes: EOM are normal. Pupils are equal, round, and reactive to light. Right eye exhibits no discharge. Left eye exhibits discharge and exudate. Right conjunctiva is not injected. Right conjunctiva has no hemorrhage. Left conjunctiva is injected. Left conjunctiva has no hemorrhage. No scleral icterus.  Neck: Normal range of motion.  Cardiovascular: Normal rate, regular rhythm and normal heart sounds.   Pulmonary/Chest: Effort normal and breath sounds normal.  Abdominal: Soft. She exhibits no distension. There is no tenderness.  Musculoskeletal: Normal range of motion.  Neurological: She is alert and oriented to person, place, and time.  Skin: Skin is warm and dry.  Psychiatric: She has a normal mood and affect. Judgment normal.    ED Course  Procedures (including critical care time)  Labs Reviewed - No data to display No results found.   1. Conjunctivitis, left eye       MDM  tobrex , 2 gtts OU QID x 5-7 days. F/u with PCP prn        Evalina Field, PA 10/26/11 2209

## 2011-10-26 NOTE — ED Notes (Signed)
Lt eye red with d/c  

## 2012-02-19 ENCOUNTER — Emergency Department (HOSPITAL_COMMUNITY)
Admission: EM | Admit: 2012-02-19 | Discharge: 2012-02-19 | Disposition: A | Discharge status: Home / Self Care | Payer: Self-pay | Attending: Emergency Medicine | Admitting: Emergency Medicine

## 2012-02-19 ENCOUNTER — Encounter (HOSPITAL_COMMUNITY): Payer: Self-pay | Admitting: *Deleted

## 2012-02-19 DIAGNOSIS — Z8719 Personal history of other diseases of the digestive system: Secondary | ICD-10-CM | POA: Insufficient documentation

## 2012-02-19 DIAGNOSIS — Z79899 Other long term (current) drug therapy: Secondary | ICD-10-CM | POA: Insufficient documentation

## 2012-02-19 DIAGNOSIS — Z87891 Personal history of nicotine dependence: Secondary | ICD-10-CM | POA: Insufficient documentation

## 2012-02-19 DIAGNOSIS — B9689 Other specified bacterial agents as the cause of diseases classified elsewhere: Secondary | ICD-10-CM

## 2012-02-19 DIAGNOSIS — Z9889 Other specified postprocedural states: Secondary | ICD-10-CM | POA: Insufficient documentation

## 2012-02-19 DIAGNOSIS — N760 Acute vaginitis: Secondary | ICD-10-CM | POA: Insufficient documentation

## 2012-02-19 DIAGNOSIS — Z3202 Encounter for pregnancy test, result negative: Secondary | ICD-10-CM | POA: Insufficient documentation

## 2012-02-19 DIAGNOSIS — I10 Essential (primary) hypertension: Secondary | ICD-10-CM | POA: Insufficient documentation

## 2012-02-19 DIAGNOSIS — Z8669 Personal history of other diseases of the nervous system and sense organs: Secondary | ICD-10-CM | POA: Insufficient documentation

## 2012-02-19 LAB — WET PREP, GENITAL
Trich, Wet Prep: NONE SEEN
Yeast Wet Prep HPF POC: NONE SEEN

## 2012-02-19 LAB — URINE MICROSCOPIC-ADD ON

## 2012-02-19 LAB — URINALYSIS, ROUTINE W REFLEX MICROSCOPIC
Glucose, UA: NEGATIVE mg/dL
Specific Gravity, Urine: >1.03 — ABNORMAL HIGH (ref 1.005–1.030)
pH: 6 (ref 5.0–8.0)

## 2012-02-19 LAB — PREGNANCY, URINE: Preg Test, Ur: NEGATIVE

## 2012-02-19 MED ORDER — METRONIDAZOLE 500 MG PO TABS: 500.0000 mg | ORAL_TABLET | Freq: Two times a day (BID) | ORAL | Status: DC

## 2012-02-19 NOTE — ED Provider Notes (Signed)
History     CSN: [id number]  Arrival date & time 02/19/12  1300   First MD Initiated Contact with Patient 02/19/12 1432      Chief Complaint  Patient presents with  . Vaginal Discharge    (Consider location/radiation/quality/duration/timing/severity/associated sxs/prior treatment) HPI Comments: Patient presents complaining of vaginal discharge that started yesterday.  She tells me that she had intercourse with a new partner on Saturday who wore a latex condom.  She has a history of latex allergy and is concerned she is having a reaction.  She denies any fevers or chills.  No bleeding.  LMP the end of November and normal.  She denies the possibility of pregnancy.  Patient is a 46 y.o. female presenting with vaginal discharge. The history is provided by the patient.  Vaginal Discharge This is a new problem. The current episode started yesterday. The problem occurs constantly. The problem has not changed since onset.Pertinent negatives include no abdominal pain. Nothing aggravates the symptoms. Nothing relieves the symptoms. She has tried nothing for the symptoms.    Past Medical History  Diagnosis Date  . Hypertension   . Acid reflux   . Neuropathy     Past Surgical History  Procedure Date  . Oophorectomy   . Cosmetic surgery     Family History  Problem Relation Age of Onset  . Hypertension Mother   . Diabetes Mother   . Hypertension Father     History  Substance Use Topics  . Smoking status: Former Smoker    Quit date: 02/06/2001  . Smokeless tobacco: Never Used  . Alcohol Use: No    OB History    Grav Para Term Preterm Abortions TAB SAB Ect Mult Living            0      Review of Systems  Gastrointestinal: Negative for abdominal pain.  Genitourinary: Positive for vaginal discharge.  All other systems reviewed and are negative.    Allergies  Bee venom; Dust mite extract; and Latex  Home Medications   Current Outpatient Rx  Name  Route  Sig  Dispense   Refill  . LISINOPRIL-HYDROCHLOROTHIAZIDE 20-12.5 MG PO TABS   Oral   Take 1 tablet by mouth daily.          Marland Kitchen LORATADINE 10 MG PO TABS   Oral   Take 10 mg by mouth daily as needed. FOR ALLERGIES           BP 164/82  Pulse 70  Temp 98.1 F (36.7 C) (Oral)  Resp 20  Ht 5\' 5"  (1.651 m)  Wt 276 lb (125.193 kg)  BMI 45.93 kg/m2  SpO2 96%  LMP 02/04/2012  Physical Exam  Nursing note and vitals reviewed. Constitutional: She is oriented to person, place, and time. She appears well-developed and well-nourished. No distress.  HENT:  Head: Normocephalic and atraumatic.  Mouth/Throat: Oropharynx is clear and moist.  Neck: Normal range of motion. Neck supple.  Abdominal: Soft. Bowel sounds are normal. She exhibits no distension. There is no tenderness.  Genitourinary: Vagina normal and uterus normal.       There is a slight clear to yellowish discharge.  There is no cmt or adnexal tenderness.  The vagina and external genitalia otherwise appears normal.  Musculoskeletal: Normal range of motion.  Neurological: She is alert and oriented to person, place, and time.  Skin: Skin is warm and dry. She is not diaphoretic.    ED Course  Procedures (including critical care  time)   Labs Reviewed  URINALYSIS, ROUTINE W REFLEX MICROSCOPIC  PREGNANCY, URINE  WET PREP, GENITAL  GC/CHLAMYDIA PROBE AMP   No results found.   No diagnosis found.    MDM  Few clue cells on wet prep, otherwise exam and labs are unremarkable.  Will treat with flagyl, return prn.  Will call if gc/chlamydia tests return and require further treatment.        Geoffery Lyons, MD 02/19/12 [phone]

## 2012-02-19 NOTE — ED Notes (Signed)
Clear vaginal discharge. Since yesterday,. Low abd cramping,

## 2012-03-13 ENCOUNTER — Emergency Department (HOSPITAL_COMMUNITY): Payer: Self-pay

## 2012-03-13 ENCOUNTER — Encounter (HOSPITAL_COMMUNITY): Payer: Self-pay | Admitting: Emergency Medicine

## 2012-03-13 ENCOUNTER — Emergency Department (HOSPITAL_COMMUNITY)
Admission: EM | Admit: 2012-03-13 | Discharge: 2012-03-13 | Disposition: A | Discharge status: Home / Self Care | Payer: Self-pay | Attending: Emergency Medicine | Admitting: Emergency Medicine

## 2012-03-13 DIAGNOSIS — S7002XA Contusion of left hip, initial encounter: Secondary | ICD-10-CM

## 2012-03-13 DIAGNOSIS — K219 Gastro-esophageal reflux disease without esophagitis: Secondary | ICD-10-CM | POA: Insufficient documentation

## 2012-03-13 DIAGNOSIS — S93601A Unspecified sprain of right foot, initial encounter: Secondary | ICD-10-CM

## 2012-03-13 DIAGNOSIS — S93401A Sprain of unspecified ligament of right ankle, initial encounter: Secondary | ICD-10-CM

## 2012-03-13 DIAGNOSIS — S63501A Unspecified sprain of right wrist, initial encounter: Secondary | ICD-10-CM

## 2012-03-13 DIAGNOSIS — Z87891 Personal history of nicotine dependence: Secondary | ICD-10-CM | POA: Insufficient documentation

## 2012-03-13 DIAGNOSIS — S93409A Sprain of unspecified ligament of unspecified ankle, initial encounter: Secondary | ICD-10-CM | POA: Insufficient documentation

## 2012-03-13 DIAGNOSIS — S63509A Unspecified sprain of unspecified wrist, initial encounter: Secondary | ICD-10-CM | POA: Insufficient documentation

## 2012-03-13 DIAGNOSIS — Y9289 Other specified places as the place of occurrence of the external cause: Secondary | ICD-10-CM | POA: Insufficient documentation

## 2012-03-13 DIAGNOSIS — S7000XA Contusion of unspecified hip, initial encounter: Secondary | ICD-10-CM | POA: Insufficient documentation

## 2012-03-13 DIAGNOSIS — Z8669 Personal history of other diseases of the nervous system and sense organs: Secondary | ICD-10-CM | POA: Insufficient documentation

## 2012-03-13 DIAGNOSIS — Y9301 Activity, walking, marching and hiking: Secondary | ICD-10-CM | POA: Insufficient documentation

## 2012-03-13 DIAGNOSIS — W108XXA Fall (on) (from) other stairs and steps, initial encounter: Secondary | ICD-10-CM | POA: Insufficient documentation

## 2012-03-13 DIAGNOSIS — Z79899 Other long term (current) drug therapy: Secondary | ICD-10-CM | POA: Insufficient documentation

## 2012-03-13 DIAGNOSIS — I10 Essential (primary) hypertension: Secondary | ICD-10-CM | POA: Insufficient documentation

## 2012-03-13 MED ORDER — IBUPROFEN 800 MG PO TABS
800.0000 mg | ORAL_TABLET | Freq: Once | ORAL | Status: AC
  Administered 2012-03-13: 800 mg via ORAL
  Filled 2012-03-13: qty 1

## 2012-03-13 MED ORDER — ONDANSETRON HCL 4 MG/2ML IJ SOLN
4.0000 mg | Freq: Once | INTRAMUSCULAR | Status: AC
  Administered 2012-03-13: 4 mg via INTRAVENOUS

## 2012-03-13 MED ORDER — HYDROMORPHONE HCL PF 1 MG/ML IJ SOLN
1.0000 mg | Freq: Once | INTRAMUSCULAR | Status: AC
  Administered 2012-03-13: 1 mg via INTRAMUSCULAR

## 2012-03-13 MED ORDER — ONDANSETRON HCL 4 MG/2ML IJ SOLN
INTRAMUSCULAR | Status: AC
  Administered 2012-03-13: 4 mg via INTRAVENOUS
  Filled 2012-03-13: qty 2

## 2012-03-13 MED ORDER — HYDROMORPHONE HCL PF 1 MG/ML IJ SOLN
INTRAMUSCULAR | Status: AC
  Administered 2012-03-13: 1 mg via INTRAMUSCULAR
  Filled 2012-03-13: qty 1
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
## 2012-03-13 NOTE — ED Notes (Signed)
Pt c/o right ankle/foot and left hip pain since falling through a step yesterday. nad noted.

## 2012-03-13 NOTE — ED Notes (Signed)
Patient with no complaints at this time. Respirations even and unlabored. Skin warm/dry. Discharge instructions reviewed with patient at this time. Patient given opportunity to voice concerns/ask questions. Patient discharged at this time and left Emergency Department with steady gait.   

## 2012-03-20 NOTE — ED Provider Notes (Signed)
Medical screening examination/treatment/procedure(s) were performed by non-physician practitioner and as supervising physician I was immediately available for consultation/collaboration.   Javonda Suh W. Onesimo Lingard, MD 03/20/12 2354 

## 2012-05-07 ENCOUNTER — Emergency Department (HOSPITAL_COMMUNITY)
Admission: EM | Admit: 2012-05-07 | Discharge: 2012-05-07 | Disposition: A | Discharge status: Home / Self Care | Payer: Self-pay | Attending: Emergency Medicine | Admitting: Emergency Medicine

## 2012-05-07 ENCOUNTER — Encounter (HOSPITAL_COMMUNITY): Payer: Self-pay

## 2012-05-07 DIAGNOSIS — Z79899 Other long term (current) drug therapy: Secondary | ICD-10-CM | POA: Insufficient documentation

## 2012-05-07 DIAGNOSIS — I10 Essential (primary) hypertension: Secondary | ICD-10-CM | POA: Insufficient documentation

## 2012-05-07 DIAGNOSIS — Z87828 Personal history of other (healed) physical injury and trauma: Secondary | ICD-10-CM | POA: Insufficient documentation

## 2012-05-07 DIAGNOSIS — Z8719 Personal history of other diseases of the digestive system: Secondary | ICD-10-CM | POA: Insufficient documentation

## 2012-05-07 DIAGNOSIS — Z87891 Personal history of nicotine dependence: Secondary | ICD-10-CM | POA: Insufficient documentation

## 2012-05-07 DIAGNOSIS — R55 Syncope and collapse: Secondary | ICD-10-CM | POA: Insufficient documentation

## 2012-05-07 DIAGNOSIS — Z8669 Personal history of other diseases of the nervous system and sense organs: Secondary | ICD-10-CM | POA: Insufficient documentation

## 2012-05-07 LAB — CBC WITH DIFFERENTIAL/PLATELET
Eosinophils Relative: 1 % (ref 0–5)
Hemoglobin: 12.6 g/dL (ref 12.0–15.0)
Lymphocytes Relative: 33 % (ref 12–46)
Lymphs Abs: 2.4 10*3/uL (ref 0.7–4.0)
MCV: 90.2 fL (ref 78.0–100.0)
Platelets: 226 10*3/uL (ref 150–400)
RBC: 4.09 MIL/uL (ref 3.87–5.11)
WBC: 7.3 10*3/uL (ref 4.0–10.5)

## 2012-05-07 LAB — BASIC METABOLIC PANEL
CO2: 29 mEq/L (ref 19–32)
Calcium: 9.5 mg/dL (ref 8.4–10.5)
Glucose, Bld: 86 mg/dL (ref 70–99)
Potassium: 3.7 mEq/L (ref 3.5–5.1)
Sodium: 135 mEq/L (ref 135–145)
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
## 2012-05-07 NOTE — ED Notes (Signed)
Pt states she is suppose to take her BP med twice a day but did not take it this morning.

## 2012-05-07 NOTE — ED Notes (Signed)
Pt states her BP med was recently change and she thinks it is to strong. States she has been dizzy

## 2012-09-10 ENCOUNTER — Encounter (HOSPITAL_COMMUNITY): Payer: Self-pay | Admitting: *Deleted

## 2012-09-10 ENCOUNTER — Emergency Department (HOSPITAL_COMMUNITY): Payer: Self-pay

## 2012-09-10 ENCOUNTER — Emergency Department (HOSPITAL_COMMUNITY)
Admission: EM | Admit: 2012-09-10 | Discharge: 2012-09-10 | Disposition: A | Discharge status: Home / Self Care | Payer: Self-pay | Attending: Emergency Medicine | Admitting: Emergency Medicine

## 2012-09-10 DIAGNOSIS — K219 Gastro-esophageal reflux disease without esophagitis: Secondary | ICD-10-CM | POA: Insufficient documentation

## 2012-09-10 DIAGNOSIS — G8929 Other chronic pain: Secondary | ICD-10-CM | POA: Insufficient documentation

## 2012-09-10 DIAGNOSIS — Z8669 Personal history of other diseases of the nervous system and sense organs: Secondary | ICD-10-CM | POA: Insufficient documentation

## 2012-09-10 DIAGNOSIS — M79671 Pain in right foot: Secondary | ICD-10-CM

## 2012-09-10 DIAGNOSIS — Z91040 Latex allergy status: Secondary | ICD-10-CM | POA: Insufficient documentation

## 2012-09-10 DIAGNOSIS — Z87891 Personal history of nicotine dependence: Secondary | ICD-10-CM | POA: Insufficient documentation

## 2012-09-10 DIAGNOSIS — Z79899 Other long term (current) drug therapy: Secondary | ICD-10-CM | POA: Insufficient documentation

## 2012-09-10 DIAGNOSIS — I10 Essential (primary) hypertension: Secondary | ICD-10-CM | POA: Insufficient documentation

## 2012-09-10 DIAGNOSIS — G8911 Acute pain due to trauma: Secondary | ICD-10-CM | POA: Insufficient documentation

## 2012-09-10 MED ORDER — TRAMADOL HCL 50 MG PO TABS: 100.0000 mg | ORAL_TABLET | Freq: Four times a day (QID) | ORAL | Status: DC | PRN

## 2012-09-10 NOTE — ED Notes (Signed)
Pt states that she sprained her right ankle new years eve, has been standing at work for the past month with increased pain to right ankle and foot area, denies any new injury. Cms intact. Pt arrived to er with ASO splint to right ankle which pt states helps with the pain

## 2012-09-10 NOTE — ED Provider Notes (Signed)
History    CSN: [id number] Arrival date & time 09/10/12  1104  First MD Initiated Contact with Patient 09/10/12 1243     Chief Complaint  Patient presents with  . Ankle Pain   (Consider location/radiation/quality/duration/timing/severity/associated sxs/prior Treatment) HPI Patient reports on New Year's Eve she was stepping on her porch and her right foot went through a board that broke and the 2 ends of the board caught her ankle. She states she was seen in the ED and given an ASO at that time. She reports she's had to wear it off and on and sometimes her ankle would hurt and other times it wouldn't. 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 She states she's having more pain and has pain in her right ankle in her right foot. She states some twisting movement makes the pain shoot up the medial aspect of her right foot/ankle. She also states standing makes her foot hurt more. She states she's been using a muscle rub and soaking her foot in Epsom salts without relief. She states yesterday she took 2 Aleve and 400 mg of ibuprofen.   PCP [facility]  Past Medical History  Diagnosis Date  . Hypertension   . Acid reflux   . Neuropathy    Past Surgical History  Procedure Laterality Date  . Oophorectomy    . Cosmetic surgery     Family History  Problem Relation Age of Onset  . Hypertension Mother   . Diabetes Mother   . Hypertension Father    History  Substance Use Topics  . Smoking status: Former Smoker    Quit date: 02/06/2001  . Smokeless tobacco: Never Used  . Alcohol Use: No   employed  OB History   Grav Para Term Preterm Abortions TAB SAB Ect Mult Living            0     Review of Systems  All other systems reviewed and are negative.    Allergies  Bee venom; Dust mite extract; Latex; and Peanut-containing drug products  Home Medications   Current  Outpatient Rx  Name  Route  Sig  Dispense  Refill  . gabapentin (NEURONTIN) 400 MG capsule   Oral   Take 400 mg by mouth at bedtime.         Marland Kitchen lisinopril-hydrochlorothiazide (PRINZIDE,ZESTORETIC) 20-12.5 MG per tablet   Oral   Take 1 tablet by mouth 2 (two) times daily.          Marland Kitchen loratadine (ALLERGY RELIEF) 10 MG tablet   Oral   Take 10 mg by mouth daily as needed for allergies.          . norgestimate-ethinyl estradiol (ORTHO-CYCLEN,SPRINTEC,PREVIFEM) 0.25-35 MG-MCG tablet   Oral   Take 1 tablet by mouth daily.         Marland Kitchen omeprazole (PRILOSEC) 20 MG capsule   Oral   Take 20 mg by mouth daily as needed (acid reflux).         Marland Kitchen oxymetazoline (AFRIN) 0.05 % nasal spray   Nasal   Place 2 sprays into the nose daily as needed for congestion.           BP 141/80  Pulse 83  Temp(Src) 98.5 F (36.9 C)  Resp 20  Ht 5\' 5"  (1.651 m)  Wt 280 lb (127.007 kg)  BMI 46.59 kg/m2  SpO2 100%  LMP 09/10/2012  Vital signs normal    Physical Exam  Nursing note and vitals reviewed. Constitutional: She is oriented to person, place, and time. She appears well-developed and well-nourished.  Non-toxic appearance. She does not appear ill. No distress.  Sleeping, pt wearing flat tennis shoes  HENT:  Head: Normocephalic and atraumatic.  Right Ear: External ear normal.  Left Ear: External ear normal.  Nose: Nose normal. No mucosal edema or rhinorrhea.  Mouth/Throat: Mucous membranes are normal. No dental abscesses or edematous.  Eyes: Conjunctivae and EOM are normal. Pupils are equal, round, and reactive to light.  Neck: Normal range of motion and full passive range of motion without pain. Neck supple.  Pulmonary/Chest: Effort normal. No respiratory distress. She has no rhonchi. She exhibits no crepitus.  Abdominal: Normal appearance.  Musculoskeletal: Normal range of motion. She exhibits no edema and no tenderness.  Moves all extremities well. Tender diffusely both medially and  laterally of her ankle on the right. She is also tender to palpation on the medial aspect of her right foot and in the sole of her foot at the insertion of the Achilles tendon without redness. There is no skin lesions seen. Pt noted to have flat arches  Neurological: She is alert and oriented to person, place, and time. She has normal strength. No cranial nerve deficit.  Skin: Skin is warm, dry and intact. No rash noted. No erythema. No pallor.  Psychiatric: She has a normal mood and affect. Her speech is normal and behavior is normal. Her mood appears not anxious.    ED Course  Procedures (including critical care time)   Pt given a new ASO to wear  Dg Ankle Complete Right  09/10/2012   *RADIOLOGY REPORT*  Clinical Data: Right foot and ankle pain.  RIGHT ANKLE - COMPLETE 3+ VIEW  Comparison: 03/13/2012  Findings: There is an old avulsion from the tip of the medial malleolus, unchanged since the prior exam.  Osseous structures of the ankle are otherwise normal.  No joint effusion.  IMPRESSION: No acute abnormality.  No change since the prior exam.   Original Report Authenticated By: Francene Boyers, M.D.   Dg Foot Complete Right  09/10/2012   *RADIOLOGY REPORT*  Clinical Data: Right foot and ankle pain since an injury in January 2014.  RIGHT FOOT COMPLETE - 3+ VIEW  Comparison: 03/13/2012  Findings: There are slight degenerative changes at the first metatarsal phalangeal joint with slight bunion formation.  Old avulsion of the tip of the medial malleolus.  Minimal degenerative changes of the first tarsometatarsal joint.  Slight dorsal spurring at the talonavicular joint.  IMPRESSION: No acute abnormality.  Degenerative changes as described, unchanged since 03/13/2012.   Original Report Authenticated By: Francene Boyers, M.D.   1. Flat feet, unspecified laterality   2. Foot pain, right   3. Ankle pain, chronic, right    New Prescriptions   TRAMADOL (ULTRAM) 50 MG TABLET    Take 2 tablets (100 mg  total) by mouth every 6 (six) hours as needed.    Devoria Albe, MD, FACEP   MDM    Ward Givens, MD 09/10/12 [phone]

## 2012-10-07 ENCOUNTER — Emergency Department (HOSPITAL_COMMUNITY)
Admission: EM | Admit: 2012-10-07 | Discharge: 2012-10-07 | Disposition: A | Discharge status: Home / Self Care | Payer: Self-pay | Attending: Emergency Medicine | Admitting: Emergency Medicine

## 2012-10-07 ENCOUNTER — Encounter (HOSPITAL_COMMUNITY): Payer: Self-pay

## 2012-10-07 DIAGNOSIS — Z91040 Latex allergy status: Secondary | ICD-10-CM | POA: Insufficient documentation

## 2012-10-07 DIAGNOSIS — T63461A Toxic effect of venom of wasps, accidental (unintentional), initial encounter: Secondary | ICD-10-CM | POA: Insufficient documentation

## 2012-10-07 DIAGNOSIS — T6391XA Toxic effect of contact with unspecified venomous animal, accidental (unintentional), initial encounter: Secondary | ICD-10-CM | POA: Insufficient documentation

## 2012-10-07 DIAGNOSIS — Y9389 Activity, other specified: Secondary | ICD-10-CM | POA: Insufficient documentation

## 2012-10-07 DIAGNOSIS — I10 Essential (primary) hypertension: Secondary | ICD-10-CM | POA: Insufficient documentation

## 2012-10-07 DIAGNOSIS — Z8719 Personal history of other diseases of the digestive system: Secondary | ICD-10-CM | POA: Insufficient documentation

## 2012-10-07 DIAGNOSIS — Z87891 Personal history of nicotine dependence: Secondary | ICD-10-CM | POA: Insufficient documentation

## 2012-10-07 DIAGNOSIS — Z8669 Personal history of other diseases of the nervous system and sense organs: Secondary | ICD-10-CM | POA: Insufficient documentation

## 2012-10-07 DIAGNOSIS — Y92009 Unspecified place in unspecified non-institutional (private) residence as the place of occurrence of the external cause: Secondary | ICD-10-CM | POA: Insufficient documentation

## 2012-10-07 DIAGNOSIS — Z79899 Other long term (current) drug therapy: Secondary | ICD-10-CM | POA: Insufficient documentation

## 2012-10-07 MED ORDER — FAMOTIDINE 20 MG PO TABS
20.0000 mg | ORAL_TABLET | Freq: Once | ORAL | Status: AC
  Administered 2012-10-07: 20 mg via ORAL
  Filled 2012-10-07: qty 1

## 2012-10-07 MED ORDER — EPINEPHRINE 0.3 MG/0.3ML IJ SOAJ: INTRAMUSCULAR | Status: DC

## 2012-10-07 MED ORDER — LORATADINE 10 MG PO TABS
10.0000 mg | ORAL_TABLET | Freq: Once | ORAL | Status: AC
  Administered 2012-10-07: 10 mg via ORAL
  Filled 2012-10-07: qty 1

## 2012-10-07 MED ORDER — PREDNISONE 50 MG PO TABS
60.0000 mg | ORAL_TABLET | Freq: Once | ORAL | Status: AC
  Administered 2012-10-07: 60 mg via ORAL
  Filled 2012-10-07: qty 1

## 2012-10-07 MED ORDER — CETIRIZINE HCL 10 MG PO TABS: 10.0000 mg | ORAL_TABLET | Freq: Every day | ORAL | Status: DC

## 2012-10-07 MED ORDER — PREDNISONE 10 MG PO TABS: ORAL_TABLET | ORAL | Status: DC

## 2012-10-07 MED ORDER — DIPHENHYDRAMINE HCL 25 MG PO TABS: ORAL_TABLET | ORAL | Status: DC

## 2012-10-07 NOTE — ED Provider Notes (Signed)
CSN: [id number]     Arrival date & time 10/07/12  1517 History     First MD Initiated Contact with Patient 10/07/12 1550     Chief Complaint  Patient presents with  . 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 She has had a reaction to bee stings in the past. No c/o SOB, facial swelling, tongue swelling, hives, or LoC reported. She now has mild burning sensation. No other stings noted.  The history is provided by the patient.    Past Medical History  Diagnosis Date  . Hypertension   . Acid reflux   . Neuropathy    Past Surgical History  Procedure Laterality Date  . Oophorectomy    . Cosmetic surgery     Family History  Problem Relation Age of Onset  . Hypertension Mother   . Diabetes Mother   . Hypertension Father    History  Substance Use Topics  . Smoking status: Former Smoker    Quit date: 02/06/2001  . Smokeless tobacco: Never Used  . Alcohol Use: No   OB History   Grav Para Term Preterm Abortions TAB SAB Ect Mult Living            0     Review of Systems  Constitutional: Negative for activity change.       All ROS Neg except as noted in HPI  HENT: Negative for nosebleeds and neck pain.   Eyes: Negative for photophobia and discharge.  Respiratory: Negative for cough, shortness of breath and wheezing.   Cardiovascular: Negative for chest pain and palpitations.  Gastrointestinal: Negative for abdominal pain and blood in stool.  Genitourinary: Negative for dysuria, frequency and hematuria.  Musculoskeletal: Negative for back pain and arthralgias.  Skin: Negative.   Neurological: Negative for dizziness, seizures and speech difficulty.  Psychiatric/Behavioral: Negative for hallucinations and confusion.    Allergies  Bee venom; Dust mite extract; Latex; and Peanut-containing drug products  Home  Medications   Current Outpatient Rx  Name  Route  Sig  Dispense  Refill  . gabapentin (NEURONTIN) 400 MG capsule   Oral   Take 400 mg by mouth at bedtime.         Marland Kitchen lisinopril-hydrochlorothiazide (PRINZIDE,ZESTORETIC) 20-12.5 MG per tablet   Oral   Take 1 tablet by mouth daily.          Marland Kitchen loratadine (ALLERGY RELIEF) 10 MG tablet   Oral   Take 10 mg by mouth daily as needed for allergies.          . norgestimate-ethinyl estradiol (ORTHO-CYCLEN,SPRINTEC,PREVIFEM) 0.25-35 MG-MCG tablet   Oral   Take 1 tablet by mouth daily.         Marland Kitchen oxymetazoline (NASAL SPRAY 12 HOUR) 0.05 % nasal spray   Nasal   Place 2 sprays into the nose daily as needed for congestion.         . traMADol (ULTRAM) 50 MG tablet   Oral   Take 2 tablets (100 mg total) by mouth every 6 (six) hours as needed.   16 tablet   0    BP 157/72  Pulse 79  Temp(Src) 98.6 F (37 C) (Oral)  Resp 20  Ht 5\' 5"  (1.651 m)  Wt 280 lb (127.007 kg)  BMI 46.59 kg/m2  SpO2 100%  LMP 09/10/2012 Physical Exam  Nursing note and vitals reviewed. Constitutional: She is oriented  to person, place, and time. She appears well-developed and well-nourished.  Non-toxic appearance.  HENT:  Head: Normocephalic.  Right Ear: Tympanic membrane and external ear normal.  Left Ear: Tympanic membrane and external ear normal.  Eyes: EOM and lids are normal. Pupils are equal, round, and reactive to light.  Neck: Normal range of motion. Neck supple. Carotid bruit is not present.  Cardiovascular: Normal rate, regular rhythm, normal heart sounds, intact distal pulses and normal pulses.   Pulmonary/Chest: Breath sounds normal. No respiratory distress.  Abdominal: Soft. Bowel sounds are normal. There is no tenderness. There is no guarding.    Musculoskeletal: Normal range of motion.  Lymphadenopathy:       Head (right side): No submandibular adenopathy present.       Head (left side): No submandibular adenopathy present.    She has  no cervical adenopathy.  Neurological: She is alert and oriented to person, place, and time. She has normal strength. No cranial nerve deficit or sensory deficit.  Skin: Skin is warm and dry.  Psychiatric: She has a normal mood and affect. Her speech is normal.    ED Course   Procedures (including critical care time)  Labs Reviewed - No data to display No results found. No diagnosis found.  MDM  *I have reviewed nursing notes, vital signs, and all appropriate lab and imaging results for this patient.** Pt sustained insect/?bee stings to the right flank. No generalizes swelling or wheeze, or emergent complication. Hx of reaction to bee stings. Pt given Rx for Epi-pen to keep at all times. Rx for prednisone, zyrtec each AM and benadryl at HS given for possible delayed reaction, at this is the pattern the previous reaction took. Pt observed in ED with no noted reactions.  Kathie Dike, PA-C 10/07/12 1648

## 2012-10-07 NOTE — ED Notes (Signed)
Area x 2 insect bites with local swelling on back

## 2012-10-07 NOTE — ED Provider Notes (Signed)
Medical screening examination/treatment/procedure(s) were performed by non-physician practitioner and as supervising physician I was immediately available for consultation/collaboration. Wasyl Dornfeld, MD, FACEP   Stephaie Dardis L Breindy Meadow, MD 10/07/12 2042 

## 2012-10-07 NOTE — ED Notes (Signed)
Pt reprots being stung by ?bee to right flank area. No resp distress.

## 2014-05-04 ENCOUNTER — Encounter (HOSPITAL_COMMUNITY): Payer: Self-pay | Admitting: Emergency Medicine

## 2014-05-04 ENCOUNTER — Emergency Department (HOSPITAL_COMMUNITY)
Admission: EM | Admit: 2014-05-04 | Discharge: 2014-05-04 | Disposition: A | Discharge status: Home / Self Care | Payer: Self-pay | Attending: Emergency Medicine | Admitting: Emergency Medicine

## 2014-05-04 ENCOUNTER — Emergency Department (HOSPITAL_COMMUNITY): Payer: Self-pay

## 2014-05-04 DIAGNOSIS — I10 Essential (primary) hypertension: Secondary | ICD-10-CM | POA: Insufficient documentation

## 2014-05-04 DIAGNOSIS — Z87891 Personal history of nicotine dependence: Secondary | ICD-10-CM | POA: Insufficient documentation

## 2014-05-04 DIAGNOSIS — Z91040 Latex allergy status: Secondary | ICD-10-CM | POA: Insufficient documentation

## 2014-05-04 DIAGNOSIS — M791 Myalgia: Secondary | ICD-10-CM | POA: Insufficient documentation

## 2014-05-04 DIAGNOSIS — Z79899 Other long term (current) drug therapy: Secondary | ICD-10-CM | POA: Insufficient documentation

## 2014-05-04 DIAGNOSIS — Z8719 Personal history of other diseases of the digestive system: Secondary | ICD-10-CM | POA: Insufficient documentation

## 2014-05-04 DIAGNOSIS — B349 Viral infection, unspecified: Secondary | ICD-10-CM | POA: Insufficient documentation

## 2014-05-04 DIAGNOSIS — G629 Polyneuropathy, unspecified: Secondary | ICD-10-CM | POA: Insufficient documentation

## 2014-05-04 DIAGNOSIS — Z3202 Encounter for pregnancy test, result negative: Secondary | ICD-10-CM | POA: Insufficient documentation

## 2014-05-04 LAB — CBC WITH DIFFERENTIAL/PLATELET
BASOS ABS: 0 10*3/uL (ref 0.0–0.1)
BASOS PCT: 0 % (ref 0–1)
EOS ABS: 0 10*3/uL (ref 0.0–0.7)
EOS PCT: 0 % (ref 0–5)
HCT: 39 % (ref 36.0–46.0)
Hemoglobin: 12.8 g/dL (ref 12.0–15.0)
Lymphocytes Relative: 10 % — ABNORMAL LOW (ref 12–46)
Lymphs Abs: 1 10*3/uL (ref 0.7–4.0)
MCH: 30.3 pg (ref 26.0–34.0)
MCHC: 32.8 g/dL (ref 30.0–36.0)
MCV: 92.4 fL (ref 78.0–100.0)
MONO ABS: 0.3 10*3/uL (ref 0.1–1.0)
MONOS PCT: 3 % (ref 3–12)
Neutro Abs: 8.2 10*3/uL — ABNORMAL HIGH (ref 1.7–7.7)
Neutrophils Relative %: 87 % — ABNORMAL HIGH (ref 43–77)
Platelets: 229 10*3/uL (ref 150–400)
RBC: 4.22 MIL/uL (ref 3.87–5.11)
RDW: 12.7 % (ref 11.5–15.5)
WBC: 9.5 10*3/uL (ref 4.0–10.5)

## 2014-05-04 LAB — URINALYSIS, ROUTINE W REFLEX MICROSCOPIC
Bilirubin Urine: NEGATIVE
Glucose, UA: NEGATIVE mg/dL
HGB URINE DIPSTICK: NEGATIVE
KETONES UR: NEGATIVE mg/dL
LEUKOCYTES UA: NEGATIVE
Nitrite: NEGATIVE
Specific Gravity, Urine: 1.02 (ref 1.005–1.030)
Urobilinogen, UA: 1 mg/dL (ref 0.0–1.0)
pH: 7 (ref 5.0–8.0)

## 2014-05-04 LAB — BASIC METABOLIC PANEL
ANION GAP: 5 (ref 5–15)
BUN: 11 mg/dL (ref 6–23)
CALCIUM: 8.7 mg/dL (ref 8.4–10.5)
CHLORIDE: 104 mmol/L (ref 96–112)
CO2: 27 mmol/L (ref 19–32)
Creatinine, Ser: 0.59 mg/dL (ref 0.50–1.10)
GFR calc Af Amer: >90 mL/min (ref >90)
GFR calc non Af Amer: >90 mL/min (ref >90)
Glucose, Bld: 105 mg/dL — ABNORMAL HIGH (ref 70–99)
Potassium: 3.7 mmol/L (ref 3.5–5.1)
Sodium: 136 mmol/L (ref 135–145)

## 2014-05-04 LAB — POC URINE PREG, ED: PREG TEST UR: NEGATIVE

## 2014-05-04 LAB — URINE MICROSCOPIC-ADD ON

## 2014-05-04 MED ORDER — TRAMADOL HCL 50 MG PO TABS: 50.0000 mg | ORAL_TABLET | Freq: Four times a day (QID) | ORAL | Status: DC | PRN

## 2014-05-04 MED ORDER — SODIUM CHLORIDE 0.9 % IV SOLN
Freq: Once | INTRAVENOUS | Status: AC
  Administered 2014-05-04: 10:00:00 via INTRAVENOUS

## 2014-05-04 MED ORDER — ONDANSETRON HCL 4 MG/2ML IJ SOLN
4.0000 mg | Freq: Once | INTRAMUSCULAR | Status: AC
  Administered 2014-05-04: 4 mg via INTRAMUSCULAR
  Filled 2014-05-04: qty 2

## 2014-05-04 MED ORDER — PROMETHAZINE HCL 12.5 MG PO TABS: 12.5000 mg | ORAL_TABLET | Freq: Four times a day (QID) | ORAL | Status: DC | PRN

## 2014-05-04 NOTE — ED Provider Notes (Signed)
CSN: [id number]     Arrival date & time 05/04/14  0907 History   First MD Initiated Contact with Patient 05/04/14 [phone]     Chief Complaint  Patient presents with  . Emesis     (Consider location/radiation/quality/duration/timing/severity/associated sxs/prior Treatment) Patient is a 49 y.o. female presenting with vomiting. The history is provided by the patient.  Emesis Duration:  9 hours Timing:  Intermittent Number of daily episodes:  5 Quality:  Stomach contents Progression:  Worsening Relieved by:  Nothing Worsened by:  Nothing tried Ineffective treatments:  None tried Associated symptoms: abdominal pain, chills, cough, diarrhea, headaches and myalgias   Associated symptoms: no arthralgias   Risk factors: sick contacts   Risk factors: no alcohol use and no diabetes     Past Medical History  Diagnosis Date  . Hypertension   . Acid reflux   . Neuropathy    Past Surgical History  Procedure Laterality Date  . Oophorectomy    . Cosmetic surgery     Family History  Problem Relation Age of Onset  . Hypertension Mother   . Diabetes Mother   . Hypertension Father    History  Substance Use Topics  . Smoking status: Former Smoker    Quit date: 02/06/2001  . Smokeless tobacco: Never Used  . Alcohol Use: No   OB History    Gravida Para Term Preterm AB TAB SAB Ectopic Multiple Living            0     Review of Systems  Constitutional: Positive for chills. Negative for activity change.       All ROS Neg except as noted in HPI  HENT: Negative for nosebleeds.   Eyes: Negative for photophobia and discharge.  Respiratory: Negative for cough, shortness of breath and wheezing.   Cardiovascular: Negative for chest pain and palpitations.  Gastrointestinal: Positive for vomiting, abdominal pain and diarrhea. Negative for blood in stool.  Genitourinary: Negative for dysuria, frequency and hematuria.  Musculoskeletal: Positive for myalgias. Negative for back pain, arthralgias  and neck pain.  Skin: Negative.   Neurological: Positive for headaches. Negative for dizziness, seizures and speech difficulty.  Psychiatric/Behavioral: Negative for hallucinations and confusion.      Allergies  Bee venom; Dust mite extract; Latex; and Peanut-containing drug products  Home Medications   Prior to Admission medications   Medication Sig Start Date End Date Taking? Authorizing Provider  cetirizine (ZYRTEC) 10 MG tablet Take 1 tablet (10 mg total) by mouth daily. 10/07/12   Lenox Ahr, PA-C  diphenhydrAMINE (BENADRYL) 25 MG tablet 1 at hs, or q6h prn itching. 10/07/12   Lenox Ahr, PA-C  EPINEPHrine (EPI-PEN) 0.3 mg/0.3 mL SOAJ Stab the outer thigh and come to ED immediately if acute allergic reactions occur. 10/07/12   Lenox Ahr, PA-C  gabapentin (NEURONTIN) 400 MG capsule Take 400 mg by mouth at bedtime.    Historical Provider, MD  lisinopril-hydrochlorothiazide (PRINZIDE,ZESTORETIC) 20-12.5 MG per tablet Take 1 tablet by mouth daily.     Historical Provider, MD  loratadine (ALLERGY RELIEF) 10 MG tablet Take 10 mg by mouth daily as needed for allergies.     Historical Provider, MD  norgestimate-ethinyl estradiol (ORTHO-CYCLEN,SPRINTEC,PREVIFEM) 0.25-35 MG-MCG tablet Take 1 tablet by mouth daily.    Historical Provider, MD  oxymetazoline (NASAL SPRAY 12 HOUR) 0.05 % nasal spray Place 2 sprays into the nose daily as needed for congestion.    Historical Provider, MD  predniSONE (DELTASONE) 10 MG tablet 5,4,3,2,1 -  take with food 10/07/12   Lenox Ahr, PA-C  traMADol (ULTRAM) 50 MG tablet Take 2 tablets (100 mg total) by mouth every 6 (six) hours as needed. 09/10/12   Janice Norrie, MD   BP 164/109 mmHg  Pulse 72  Temp(Src) 98.4 F (36.9 C)  Resp 18  Ht 5\' 5"  (1.651 m)  Wt 296 lb (134.265 kg)  BMI 49.26 kg/m2  SpO2 100%  LMP 04/06/2014 Physical Exam  Constitutional: She is oriented to person, place, and time. She appears well-developed and well-nourished.   Non-toxic appearance.  HENT:  Head: Normocephalic.  Right Ear: Tympanic membrane and external ear normal.  Left Ear: Tympanic membrane and external ear normal.  Nasal congestion  Eyes: EOM and lids are normal. Pupils are equal, round, and reactive to light.  Neck: Normal range of motion. Neck supple. Carotid bruit is not present.  Cardiovascular: Normal rate, regular rhythm, normal heart sounds, intact distal pulses and normal pulses.   Pulmonary/Chest: Breath sounds normal. No respiratory distress.  Abdominal: Soft. Bowel sounds are normal. She exhibits no distension and no mass. There is no tenderness. There is no rebound and no guarding.  Diffuse abd soreness.   Musculoskeletal: Normal range of motion.  Lymphadenopathy:       Head (right side): No submandibular adenopathy present.       Head (left side): No submandibular adenopathy present.    She has no cervical adenopathy.  Neurological: She is alert and oriented to person, place, and time. She has normal strength. No cranial nerve deficit or sensory deficit.  Skin: Skin is warm and dry.  Psychiatric: She has a normal mood and affect. Her speech is normal.  Nursing note and vitals reviewed.   ED Course  Procedures (including critical care time) Labs Review Labs Reviewed  CBC WITH DIFFERENTIAL/PLATELET  BASIC METABOLIC PANEL  URINALYSIS, ROUTINE W REFLEX MICROSCOPIC    Imaging Review No results found.   EKG Interpretation None      MDM  Urine preg negative. UA neg. CBC and Bmet non-acute  Suspect viral gastroenteritis. Hx of hypertention. No evidence for end organ damage.  Rx for tramadol and promethazine given to the patient. Pt to return if any changes or problem.   Final diagnoses:  Viral illness  Essential hypertension    **I have reviewed nursing notes, vital signs, and all appropriate lab and imaging results for this patient.Lenox Ahr, PA-C 05/06/14 1921  Johnna Acosta, MD 05/07/14  [phone]

## 2014-05-04 NOTE — ED Notes (Signed)
Pt c/o nasal congestion x 3 days with n/v/d since midnight.

## 2014-05-04 NOTE — Discharge Instructions (Signed)
Your test are negative for acute events. Please increase fluids. Please use Phenergan for nausea. Use Tylenol or ibuprofen for body aches. Use tramadol for more severe pain or aching. Please see your primary physician if not improving.  Viral Infections A virus is a type of germ. Viruses can cause:  Minor sore throats.  Aches and pains.  Headaches.  Runny nose.  Rashes.  Watery eyes.  Tiredness.  Coughs.  Loss of appetite.  Feeling sick to your stomach (nausea).  Throwing up (vomiting).  Watery poop (diarrhea). HOME CARE   Only take medicines as told by your doctor.  Drink enough water and fluids to keep your pee (urine) clear or pale yellow. Sports drinks are a good choice.  Get plenty of rest and eat healthy. Soups and broths with crackers or rice are fine. GET HELP RIGHT AWAY IF:   You have a very bad headache.  You have shortness of breath.  You have chest pain or neck pain.  You have an unusual rash.  You cannot stop throwing up.  You have watery poop that does not stop.  You cannot keep fluids down.  You or your child has a temperature by mouth above 102 F (38.9 C), not controlled by medicine.  Your baby is older than 3 months with a rectal temperature of 102 F (38.9 C) or higher.  Your baby is 16 months old or younger with a rectal temperature of 100.4 F (38 C) or higher. MAKE SURE YOU:   Understand these instructions.  Will watch this condition.  Will get help right away if you are not doing well or get worse. Document Released: 02/10/2008 Document Revised: 05/22/2011 Document Reviewed: 07/05/2010 [facility] Patient Information 2015 [place], [place]. This information is not intended to replace advice given to you by your health care provider. Make sure you discuss any questions you have with your health care provider.

## 2014-07-06 ENCOUNTER — Emergency Department (HOSPITAL_COMMUNITY)
Admission: EM | Admit: 2014-07-06 | Discharge: 2014-07-06 | Disposition: A | Discharge status: Home / Self Care | Payer: Self-pay | Attending: Emergency Medicine | Admitting: Emergency Medicine

## 2014-07-06 ENCOUNTER — Encounter (HOSPITAL_COMMUNITY): Payer: Self-pay | Admitting: Emergency Medicine

## 2014-07-06 DIAGNOSIS — R509 Fever, unspecified: Secondary | ICD-10-CM | POA: Insufficient documentation

## 2014-07-06 DIAGNOSIS — R1013 Epigastric pain: Secondary | ICD-10-CM | POA: Insufficient documentation

## 2014-07-06 DIAGNOSIS — Z8719 Personal history of other diseases of the digestive system: Secondary | ICD-10-CM | POA: Insufficient documentation

## 2014-07-06 DIAGNOSIS — Z79899 Other long term (current) drug therapy: Secondary | ICD-10-CM | POA: Insufficient documentation

## 2014-07-06 DIAGNOSIS — Z87891 Personal history of nicotine dependence: Secondary | ICD-10-CM | POA: Insufficient documentation

## 2014-07-06 DIAGNOSIS — I10 Essential (primary) hypertension: Secondary | ICD-10-CM | POA: Insufficient documentation

## 2014-07-06 DIAGNOSIS — Z8669 Personal history of other diseases of the nervous system and sense organs: Secondary | ICD-10-CM | POA: Insufficient documentation

## 2014-07-06 LAB — CBC WITH DIFFERENTIAL/PLATELET
BASOS ABS: 0 10*3/uL (ref 0.0–0.1)
BASOS PCT: 0 % (ref 0–1)
Eosinophils Absolute: 0 10*3/uL (ref 0.0–0.7)
Eosinophils Relative: 0 % (ref 0–5)
HCT: 40 % (ref 36.0–46.0)
HEMOGLOBIN: 13.1 g/dL (ref 12.0–15.0)
LYMPHS PCT: 12 % (ref 12–46)
Lymphs Abs: 1 10*3/uL (ref 0.7–4.0)
MCH: 30 pg (ref 26.0–34.0)
MCHC: 32.8 g/dL (ref 30.0–36.0)
MCV: 91.7 fL (ref 78.0–100.0)
MONO ABS: 0.2 10*3/uL (ref 0.1–1.0)
MONOS PCT: 3 % (ref 3–12)
NEUTROS ABS: 7.4 10*3/uL (ref 1.7–7.7)
NEUTROS PCT: 85 % — AB (ref 43–77)
PLATELETS: 224 10*3/uL (ref 150–400)
RBC: 4.36 MIL/uL (ref 3.87–5.11)
RDW: 12.6 % (ref 11.5–15.5)
WBC: 8.6 10*3/uL (ref 4.0–10.5)

## 2014-07-06 LAB — COMPREHENSIVE METABOLIC PANEL
ALK PHOS: 56 U/L (ref 39–117)
ALT: 13 U/L (ref 0–35)
AST: 18 U/L (ref 0–37)
Albumin: 4.1 g/dL (ref 3.5–5.2)
Anion gap: 7 (ref 5–15)
BILIRUBIN TOTAL: 0.5 mg/dL (ref 0.3–1.2)
BUN: 16 mg/dL (ref 6–23)
CALCIUM: 9.1 mg/dL (ref 8.4–10.5)
CO2: 28 mmol/L (ref 19–32)
Chloride: 100 mmol/L (ref 96–112)
Creatinine, Ser: 0.68 mg/dL (ref 0.50–1.10)
GLUCOSE: 117 mg/dL — AB (ref 70–99)
Potassium: 3.8 mmol/L (ref 3.5–5.1)
SODIUM: 135 mmol/L (ref 135–145)
TOTAL PROTEIN: 7.9 g/dL (ref 6.0–8.3)

## 2014-07-06 LAB — URINALYSIS, ROUTINE W REFLEX MICROSCOPIC
Bilirubin Urine: NEGATIVE
Glucose, UA: NEGATIVE mg/dL
KETONES UR: NEGATIVE mg/dL
LEUKOCYTES UA: NEGATIVE
Nitrite: NEGATIVE
PH: 7.5 (ref 5.0–8.0)
Specific Gravity, Urine: 1.015 (ref 1.005–1.030)
UROBILINOGEN UA: 0.2 mg/dL (ref 0.0–1.0)

## 2014-07-06 LAB — URINE MICROSCOPIC-ADD ON

## 2014-07-06 LAB — LIPASE, BLOOD: Lipase: 28 U/L (ref 11–59)

## 2014-07-06 MED ORDER — TRAMADOL HCL 50 MG PO TABS
50.0000 mg | ORAL_TABLET | Freq: Four times a day (QID) | ORAL | Status: DC | PRN
Start: 2014-07-06 — End: 2014-11-09

## 2014-07-06 MED ORDER — MORPHINE SULFATE 4 MG/ML IJ SOLN
4.0000 mg | Freq: Once | INTRAMUSCULAR | Status: AC
  Administered 2014-07-06: 4 mg via INTRAVENOUS
  Filled 2014-07-06: qty 1

## 2014-07-06 MED ORDER — METOCLOPRAMIDE HCL 5 MG/ML IJ SOLN
10.0000 mg | Freq: Once | INTRAMUSCULAR | Status: AC
  Administered 2014-07-06: 10 mg via INTRAVENOUS
  Filled 2014-07-06: qty 2

## 2014-07-06 MED ORDER — ONDANSETRON 8 MG PO TBDP
8.0000 mg | ORAL_TABLET | Freq: Once | ORAL | Status: AC
  Administered 2014-07-06: 8 mg via ORAL
  Filled 2014-07-06: qty 1

## 2014-07-06 MED ORDER — PROMETHAZINE HCL 25 MG PO TABS: 25.0000 mg | ORAL_TABLET | Freq: Four times a day (QID) | ORAL | Status: DC | PRN

## 2014-07-06 MED ORDER — SODIUM CHLORIDE 0.9 % IV BOLUS (SEPSIS)
1000.0000 mL | Freq: Once | INTRAVENOUS | Status: AC
  Administered 2014-07-06: 1000 mL via INTRAVENOUS

## 2014-07-06 NOTE — Discharge Instructions (Signed)

## 2014-07-06 NOTE — ED Provider Notes (Signed)
CSN: [id number]     Arrival date & time 07/06/14  0935 History  This chart was scribed for Orpah Greek, MD by Molli Posey, ED Scribe. This patient was seen in room APA17[facility] and the patient's care was started 12:13 PM.  Chief Complaint  Patient presents with  . Emesis   HPI HPI Comments: Diana Blevins is a 49 y.o. female with a history of HTN, acid reflux and neuropathy who presents to the Emergency Department complaining of vomiting that started last night at [facility]. Pt reports associated upper abdominal pain and says that she has vomited 3 times today PTA.  She says that she has been experiencing a subjective fever and chills as well. She says she has tried OTC medications with no relief. Pt reports no alleviating or exacerbating at this time.   Past Medical History  Diagnosis Date  . Hypertension   . Acid reflux   . Neuropathy    Past Surgical History  Procedure Laterality Date  . Oophorectomy    . Cosmetic surgery     Family History  Problem Relation Age of Onset  . Hypertension Mother   . Diabetes Mother   . Hypertension Father    History  Substance Use Topics  . Smoking status: Former Smoker    Quit date: 02/06/2001  . Smokeless tobacco: Never Used  . Alcohol Use: Yes     Comment: occas   OB History    Gravida Para Term Preterm AB TAB SAB Ectopic Multiple Living            0     Review of Systems  Constitutional: Positive for fever and chills.  Gastrointestinal: Positive for nausea, vomiting and abdominal pain.  All other systems reviewed and are negative.     Allergies  Bee venom; Dust mite extract; Latex; and Peanut-containing drug products  Home Medications   Prior to Admission medications   Medication Sig Start Date End Date Taking? Authorizing Provider  lisinopril-hydrochlorothiazide (PRINZIDE,ZESTORETIC) 20-12.5 MG per tablet Take 1 tablet by mouth daily.    Yes Historical Provider, MD  loratadine (ALLERGY RELIEF) 10 MG tablet Take  10 mg by mouth daily as needed for allergies.    Yes Historical Provider, MD  oxymetazoline (NASAL SPRAY 12 HOUR) 0.05 % nasal spray Place 2 sprays into the nose daily as needed for congestion.   Yes Historical Provider, MD  cetirizine (ZYRTEC) 10 MG tablet Take 1 tablet (10 mg total) by mouth daily. Patient not taking: Reported on 05/04/2014 10/07/12   Lily Kocher, PA-C  diphenhydrAMINE (BENADRYL) 25 MG tablet 1 at hs, or q6h prn itching. Patient not taking: Reported on 05/04/2014 10/07/12   Lily Kocher, PA-C  EPINEPHrine (EPI-PEN) 0.3 mg/0.3 mL SOAJ Stab the outer thigh and come to ED immediately if acute allergic reactions occur. Patient not taking: Reported on 05/04/2014 10/07/12   Lily Kocher, PA-C  predniSONE (DELTASONE) 10 MG tablet 5,4,3,2,1 - take with food Patient not taking: Reported on 05/04/2014 10/07/12   Lily Kocher, PA-C  promethazine (PHENERGAN) 25 MG tablet Take 1 tablet (25 mg total) by mouth every 6 (six) hours as needed for nausea or vomiting. 07/06/14   Orpah Greek, MD  traMADol (ULTRAM) 50 MG tablet Take 1 tablet (50 mg total) by mouth every 6 (six) hours as needed. 07/06/14   Orpah Greek, MD   BP 120/62 mmHg  Pulse 73  Temp(Src) 97.5 F (36.4 C) (Oral)  Resp 18  Ht 5\' 6"  (1.676  m)  Wt 296 lb (134.265 kg)  BMI 47.80 kg/m2  SpO2 100%  LMP 06/05/2014 (Approximate) Physical Exam  Constitutional: She is oriented to person, place, and time. She appears well-developed and well-nourished. No distress.  HENT:  Head: Normocephalic and atraumatic.  Right Ear: Hearing normal.  Left Ear: Hearing normal.  Nose: Nose normal.  Mouth/Throat: Oropharynx is clear and moist and mucous membranes are normal.  Eyes: Conjunctivae and EOM are normal. Pupils are equal, round, and reactive to light.  Neck: Normal range of motion. Neck supple.  Cardiovascular: Regular rhythm, S1 normal and S2 normal.  Exam reveals no gallop and no friction rub.   No murmur  heard. Pulmonary/Chest: Effort normal and breath sounds normal. No respiratory distress. She exhibits no tenderness.  Abdominal: Soft. Normal appearance and bowel sounds are normal. There is no hepatosplenomegaly. There is tenderness. There is no rebound, no guarding, no tenderness at McBurney's point and negative Murphy's sign. No hernia.  Epigastric tenderness  Musculoskeletal: Normal range of motion.  Neurological: She is alert and oriented to person, place, and time. She has normal strength. No cranial nerve deficit or sensory deficit. Coordination normal. GCS eye subscore is 4. GCS verbal subscore is 5. GCS motor subscore is 6.  Skin: Skin is warm, dry and intact. No rash noted. No cyanosis.  Psychiatric: She has a normal mood and affect. Her speech is normal and behavior is normal. Thought content normal.  Nursing note and vitals reviewed.   ED Course  Procedures   DIAGNOSTIC STUDIES: Oxygen Saturation is 100% on RA, normal by my interpretation.    COORDINATION OF CARE: 12:14 PM Discussed treatment plan with pt at bedside and pt agreed to plan.   Labs Review Labs Reviewed  URINALYSIS, ROUTINE W REFLEX MICROSCOPIC - Abnormal; Notable for the following:    Hgb urine dipstick TRACE (*)    Protein, ur TRACE (*)    All other components within normal limits  URINE MICROSCOPIC-ADD ON - Abnormal; Notable for the following:    Bacteria, UA MANY (*)    Casts GRANULAR CAST (*)    All other components within normal limits  CBC WITH DIFFERENTIAL/PLATELET - Abnormal; Notable for the following:    Neutrophils Relative % 85 (*)    All other components within normal limits  COMPREHENSIVE METABOLIC PANEL - Abnormal; Notable for the following:    Glucose, Bld 117 (*)    All other components within normal limits  LIPASE, BLOOD    Imaging Review No results found.   EKG Interpretation None      MDM   Final diagnoses:  Epigastric pain   Patient presents to the ER for evaluation of  epigastric pain with nausea and vomiting. She did have tenderness in the epigastric region but there was no right upper quadrant tenderness or Murphy sign. Patient had normal blood work including no leukocytosis. She is afebrile. Liver function tests were normal. Patient was administered morphine and Reglan, has had complete resolution of symptoms. Patient is appropriate for discharge with primary care physician. Based on examination, doubt gallbladder disease, but would consider cholelithiasis if symptoms recur.  I personally performed the services described in this documentation, which was scribed in my presence. The recorded information has been reviewed and is accurate.       Orpah Greek, MD 07/06/14 [phone]

## 2014-07-06 NOTE — ED Notes (Signed)
Pt c/o abd pain and emesis.

## 2014-07-06 NOTE — ED Notes (Signed)
Pt sitting up in chair vomiting in trash can, in NAD. States "I just threw up that nausea pill you gave me"

## 2014-11-09 ENCOUNTER — Encounter (HOSPITAL_COMMUNITY): Payer: Self-pay | Admitting: *Deleted

## 2014-11-09 ENCOUNTER — Emergency Department (HOSPITAL_COMMUNITY)
Admission: EM | Admit: 2014-11-09 | Discharge: 2014-11-09 | Disposition: A | Discharge status: Home / Self Care | Payer: Self-pay | Attending: Emergency Medicine | Admitting: Emergency Medicine

## 2014-11-09 DIAGNOSIS — Z79899 Other long term (current) drug therapy: Secondary | ICD-10-CM | POA: Insufficient documentation

## 2014-11-09 DIAGNOSIS — I10 Essential (primary) hypertension: Secondary | ICD-10-CM | POA: Insufficient documentation

## 2014-11-09 DIAGNOSIS — K2900 Acute gastritis without bleeding: Secondary | ICD-10-CM | POA: Insufficient documentation

## 2014-11-09 DIAGNOSIS — Z87891 Personal history of nicotine dependence: Secondary | ICD-10-CM | POA: Insufficient documentation

## 2014-11-09 DIAGNOSIS — Z91040 Latex allergy status: Secondary | ICD-10-CM | POA: Insufficient documentation

## 2014-11-09 DIAGNOSIS — Z8669 Personal history of other diseases of the nervous system and sense organs: Secondary | ICD-10-CM | POA: Insufficient documentation

## 2014-11-09 LAB — COMPREHENSIVE METABOLIC PANEL
ALBUMIN: 4.5 g/dL (ref 3.5–5.0)
ALT: 11 U/L — ABNORMAL LOW (ref 14–54)
AST: 20 U/L (ref 15–41)
Alkaline Phosphatase: 52 U/L (ref 38–126)
Anion gap: 11 (ref 5–15)
BUN: 21 mg/dL — ABNORMAL HIGH (ref 6–20)
CO2: 23 mmol/L (ref 22–32)
Calcium: 9.6 mg/dL (ref 8.9–10.3)
Chloride: 103 mmol/L (ref 101–111)
Creatinine, Ser: 0.87 mg/dL (ref 0.44–1.00)
GFR calc Af Amer: >60 mL/min (ref >60)
GFR calc non Af Amer: >60 mL/min (ref >60)
GLUCOSE: 149 mg/dL — AB (ref 65–99)
POTASSIUM: 3.5 mmol/L (ref 3.5–5.1)
Sodium: 137 mmol/L (ref 135–145)
Total Bilirubin: 0.9 mg/dL (ref 0.3–1.2)
Total Protein: 8.5 g/dL — ABNORMAL HIGH (ref 6.5–8.1)

## 2014-11-09 LAB — CBC WITH DIFFERENTIAL/PLATELET
Basophils Absolute: 0 10*3/uL (ref 0.0–0.1)
Basophils Relative: 0 % (ref 0–1)
Eosinophils Absolute: 0 10*3/uL (ref 0.0–0.7)
Eosinophils Relative: 0 % (ref 0–5)
HEMATOCRIT: 41.3 % (ref 36.0–46.0)
Hemoglobin: 14 g/dL (ref 12.0–15.0)
LYMPHS ABS: 1.1 10*3/uL (ref 0.7–4.0)
Lymphocytes Relative: 10 % — ABNORMAL LOW (ref 12–46)
MCH: 30.4 pg (ref 26.0–34.0)
MCHC: 33.9 g/dL (ref 30.0–36.0)
MCV: 89.8 fL (ref 78.0–100.0)
Monocytes Absolute: 0.2 10*3/uL (ref 0.1–1.0)
Monocytes Relative: 2 % — ABNORMAL LOW (ref 3–12)
NEUTROS ABS: 9.4 10*3/uL — AB (ref 1.7–7.7)
Neutrophils Relative %: 88 % — ABNORMAL HIGH (ref 43–77)
Platelets: 254 10*3/uL (ref 150–400)
RBC: 4.6 MIL/uL (ref 3.87–5.11)
RDW: 12.7 % (ref 11.5–15.5)
WBC: 10.7 10*3/uL — ABNORMAL HIGH (ref 4.0–10.5)

## 2014-11-09 LAB — LIPASE, BLOOD: Lipase: 18 U/L — ABNORMAL LOW (ref 22–51)

## 2014-11-09 MED ORDER — ONDANSETRON HCL 4 MG/2ML IJ SOLN
4.0000 mg | Freq: Once | INTRAMUSCULAR | Status: AC
  Administered 2014-11-09: 4 mg via INTRAVENOUS
  Filled 2014-11-09: qty 2

## 2014-11-09 MED ORDER — MORPHINE SULFATE (PF) 4 MG/ML IV SOLN
4.0000 mg | Freq: Once | INTRAVENOUS | Status: AC
  Administered 2014-11-09: 4 mg via INTRAVENOUS
  Filled 2014-11-09: qty 1

## 2014-11-09 MED ORDER — ONDANSETRON HCL 4 MG PO TABS: 4.0000 mg | ORAL_TABLET | Freq: Four times a day (QID) | ORAL | Status: DC | PRN

## 2014-11-09 MED ORDER — SODIUM CHLORIDE 0.9 % IV SOLN
1000.0000 mL | Freq: Once | INTRAVENOUS | Status: AC
  Administered 2014-11-09: 1000 mL via INTRAVENOUS

## 2014-11-09 MED ORDER — PANTOPRAZOLE SODIUM 40 MG IV SOLR
40.0000 mg | Freq: Once | INTRAVENOUS | Status: AC
  Administered 2014-11-09: 40 mg via INTRAVENOUS
  Filled 2014-11-09: qty 40

## 2014-11-09 MED ORDER — PANTOPRAZOLE SODIUM 40 MG PO TBEC: 40.0000 mg | DELAYED_RELEASE_TABLET | Freq: Every day | ORAL | Status: DC

## 2014-11-09 MED ORDER — SODIUM CHLORIDE 0.9 % IV SOLN: 1000.0000 mL | INTRAVENOUS | Status: DC

## 2014-11-09 NOTE — ED Notes (Signed)
Pt c/o abdominal pain.  Dr Roxanne Mins notified.  Pt also says she do not have money or insurance to get medications.  Pt given Zofran 6 pack as ordered and given Morphine 4 mg IVF .

## 2014-11-09 NOTE — ED Notes (Signed)
Pt became comfortable and briefly fell asleep, O2 sat dropped to 77%.  Applied 2L O2 and informed Dr Roxanne Mins

## 2014-11-09 NOTE — ED Notes (Signed)
Pt c/o n/v, epigastric pain that started during the night, pt diaphoretic on arrival to er,

## 2014-11-09 NOTE — ED Provider Notes (Signed)
CSN: [id number]     Arrival date & time 11/09/14  0524 History   First MD Initiated Contact with Patient 11/09/14 [phone]     Chief Complaint  Patient presents with  . Abdominal Pain     (Consider location/radiation/quality/duration/timing/severity/associated sxs/prior Treatment) The history is provided by the patient.   49 year old female had onset at 35 PM of nausea and vomiting. There is associated diaphoresis. She's also developed a sharp epigastric pain without radiation. She rates pain at 10/10. She denies constipation or diarrhea. Nothing makes symptoms better nothing makes them more severe to try taking oral fluids at home, but vomited them back up. She relates that symptoms started after eating a hamburger at a relative's Hospital is far she knows, nobody else who ate there has gotten sick. She denies any sick contacts.  Past Medical History  Diagnosis Date  . Hypertension   . Acid reflux   . Neuropathy    Past Surgical History  Procedure Laterality Date  . Oophorectomy    . Cosmetic surgery     Family History  Problem Relation Age of Onset  . Hypertension Mother   . Diabetes Mother   . Hypertension Father    Social History  Substance Use Topics  . Smoking status: Former Smoker    Quit date: 02/06/2001  . Smokeless tobacco: Never Used  . Alcohol Use: Yes     Comment: occas   OB History    Gravida Para Term Preterm AB TAB SAB Ectopic Multiple Living            0     Review of Systems  All other systems reviewed and are negative.     Allergies  Bee venom; Dust mite extract; Latex; and Peanut-containing drug products  Home Medications   Prior to Admission medications   Medication Sig Start Date End Date Taking? Authorizing Provider  cetirizine (ZYRTEC) 10 MG tablet Take 1 tablet (10 mg total) by mouth daily. Patient not taking: Reported on 05/04/2014 10/07/12   Lily Kocher, PA-C  diphenhydrAMINE (BENADRYL) 25 MG tablet 1 at hs, or q6h prn itching. Patient  not taking: Reported on 05/04/2014 10/07/12   Lily Kocher, PA-C  EPINEPHrine (EPI-PEN) 0.3 mg/0.3 mL SOAJ Stab the outer thigh and come to ED immediately if acute allergic reactions occur. Patient not taking: Reported on 05/04/2014 10/07/12   Lily Kocher, PA-C  lisinopril-hydrochlorothiazide (PRINZIDE,ZESTORETIC) 20-12.5 MG per tablet Take 1 tablet by mouth daily.     Historical Provider, MD  loratadine (ALLERGY RELIEF) 10 MG tablet Take 10 mg by mouth daily as needed for allergies.     Historical Provider, MD  oxymetazoline (NASAL SPRAY 12 HOUR) 0.05 % nasal spray Place 2 sprays into the nose daily as needed for congestion.    Historical Provider, MD  predniSONE (DELTASONE) 10 MG tablet 5,4,3,2,1 - take with food Patient not taking: Reported on 05/04/2014 10/07/12   Lily Kocher, PA-C  promethazine (PHENERGAN) 25 MG tablet Take 1 tablet (25 mg total) by mouth every 6 (six) hours as needed for nausea or vomiting. 07/06/14   Orpah Greek, MD  traMADol (ULTRAM) 50 MG tablet Take 1 tablet (50 mg total) by mouth every 6 (six) hours as needed. 07/06/14   Orpah Greek, MD   BP 129/79 mmHg  Pulse 54  Temp(Src) 98.1 F (36.7 C) (Oral)  Resp 18  Ht 5\' 5"  (1.651 m)  Wt 283 lb (128.368 kg)  BMI 47.09 kg/m2  SpO2 100%  LMP 11/01/2014  Physical Exam  Nursing note and vitals reviewed.  49 year old female, resting comfortably and in no acute distress. Vital signs are significant for bradycardia. Oxygen saturation is 100%, which is normal. Head is normocephalic and atraumatic. PERRLA, EOMI. Oropharynx is clear. Neck is nontender and supple without adenopathy or JVD. Back is nontender and there is no CVA tenderness. Lungs are clear without rales, wheezes, or rhonchi. Chest is nontender. Heart has regular rate and rhythm without murmur. Abdomen is soft, flat, with mild epigastric tenderness. There is no rebound or guarding. There are no masses or hepatosplenomegaly and peristalsis is  hypoactive. Extremities have no cyanosis or edema, full range of motion is present. Skin is warm and dry without rash. Neurologic: Mental status is normal, cranial nerves are intact, there are no motor or sensory deficits.  ED Course  Procedures (including critical care time) Labs Review Results for orders placed or performed during the hospital encounter of 11/09/14  Comprehensive metabolic panel  Result Value Ref Range   Sodium 137 135 - 145 mmol/L   Potassium 3.5 3.5 - 5.1 mmol/L   Chloride 103 101 - 111 mmol/L   CO2 23 22 - 32 mmol/L   Glucose, Bld 149 (H) 65 - 99 mg/dL   BUN 21 (H) 6 - 20 mg/dL   Creatinine, Ser 0.87 0.44 - 1.00 mg/dL   Calcium 9.6 8.9 - 10.3 mg/dL   Total Protein 8.5 (H) 6.5 - 8.1 g/dL   Albumin 4.5 3.5 - 5.0 g/dL   AST 20 15 - 41 U/L   ALT 11 (L) 14 - 54 U/L   Alkaline Phosphatase 52 38 - 126 U/L   Total Bilirubin 0.9 0.3 - 1.2 mg/dL   GFR calc non Af Amer >60 >60 mL/min   GFR calc Af Amer >60 >60 mL/min   Anion gap 11 5 - 15  Lipase, blood  Result Value Ref Range   Lipase 18 (L) 22 - 51 U/L  CBC with Differential  Result Value Ref Range   WBC 10.7 (H) 4.0 - 10.5 K/uL   RBC 4.60 3.87 - 5.11 MIL/uL   Hemoglobin 14.0 12.0 - 15.0 g/dL   HCT 41.3 36.0 - 46.0 %   MCV 89.8 78.0 - 100.0 fL   MCH 30.4 26.0 - 34.0 pg   MCHC 33.9 30.0 - 36.0 g/dL   RDW 12.7 11.5 - 15.5 %   Platelets 254 150 - 400 K/uL   Neutrophils Relative % 88 (H) 43 - 77 %   Neutro Abs 9.4 (H) 1.7 - 7.7 K/uL   Lymphocytes Relative 10 (L) 12 - 46 %   Lymphs Abs 1.1 0.7 - 4.0 K/uL   Monocytes Relative 2 (L) 3 - 12 %   Monocytes Absolute 0.2 0.1 - 1.0 K/uL   Eosinophils Relative 0 0 - 5 %   Eosinophils Absolute 0.0 0.0 - 0.7 K/uL   Basophils Relative 0 0 - 1 %   Basophils Absolute 0.0 0.0 - 0.1 K/uL   I have personally reviewed and evaluated these lab results as part of my medical decision-making.   MDM   Final diagnoses:  Acute gastritis without hemorrhage    Nausea and  vomiting with epigastric pain and tenderness. This is consistent with acute gastritis. Possibly viral, possibly secondary to food poisoning. She will be given IV hydration and IV ondansetron for nausea and also IV pantoprazole.  Following above-noted treatment, patient was noted to be sleeping soundly. However, when awakened, she states that  her pain was still present and still severe. Immediately after answering, she goes back to sleep. WBC is minimally elevated with left shift, but exam is benign. She is discharged with prescriptions for pantoprazole and ondansetron. Return precautions given. Follow-up with PCP.  Delora Fuel, MD 41/93/79 0240

## 2014-11-09 NOTE — Discharge Instructions (Signed)
Gastritis, Adult Gastritis is soreness and swelling (inflammation) of the lining of the stomach. Gastritis can develop as a sudden onset (acute) or long-term (chronic) condition. If gastritis is not treated, it can lead to stomach bleeding and ulcers. CAUSES  Gastritis occurs when the stomach lining is weak or damaged. Digestive juices from the stomach then inflame the weakened stomach lining. The stomach lining may be weak or damaged due to viral or bacterial infections. One common bacterial infection is the Helicobacter pylori infection. Gastritis can also result from excessive alcohol consumption, taking certain medicines, or having too much acid in the stomach.  SYMPTOMS  In some cases, there are no symptoms. When symptoms are present, they may include:  Pain or a burning sensation in the upper abdomen.  Nausea.  Vomiting.  An uncomfortable feeling of fullness after eating. DIAGNOSIS  Your caregiver may suspect you have gastritis based on your symptoms and a physical exam. To determine the cause of your gastritis, your caregiver may perform the following:  Blood or stool tests to check for the H pylori bacterium.  Gastroscopy. A thin, flexible tube (endoscope) is passed down the esophagus and into the stomach. The endoscope has a light and camera on the end. Your caregiver uses the endoscope to view the inside of the stomach.  Taking a tissue sample (biopsy) from the stomach to examine under a microscope. TREATMENT  Depending on the cause of your gastritis, medicines may be prescribed. If you have a bacterial infection, such as an H pylori infection, antibiotics may be given. If your gastritis is caused by too much acid in the stomach, H2 blockers or antacids may be given. Your caregiver may recommend that you stop taking aspirin, ibuprofen, or other nonsteroidal anti-inflammatory drugs (NSAIDs). HOME CARE INSTRUCTIONS  Only take over-the-counter or prescription medicines as directed by  your caregiver.  If you were given antibiotic medicines, take them as directed. Finish them even if you start to feel better.  Drink enough fluids to keep your urine clear or pale yellow.  Avoid foods and drinks that make your symptoms worse, such as:  Caffeine or alcoholic drinks.  Chocolate.  Peppermint or mint flavorings.  Garlic and onions.  Spicy foods.  Citrus fruits, such as oranges, lemons, or limes.  Tomato-based foods such as sauce, chili, salsa, and pizza.  Fried and fatty foods.  Eat small, frequent meals instead of large meals. SEEK IMMEDIATE MEDICAL CARE IF:   You have black or dark red stools.  You vomit blood or material that looks like coffee grounds.  You are unable to keep fluids down.  Your abdominal pain gets worse.  You have a fever.  You do not feel better after 1 week.  You have any other questions or concerns. MAKE SURE YOU:  Understand these instructions.  Will watch your condition.  Will get help right away if you are not doing well or get worse. Document Released: 02/21/2001 Document Revised: 08/29/2011 Document Reviewed: 04/12/2011 [facility] Patient Information 2015 [place], [place]. This information is not intended to replace advice given to you by your health care provider. Make sure you discuss any questions you have with your health care provider.  Pantoprazole tablets What is this medicine? PANTOPRAZOLE (pan TOE pra zole) prevents the production of acid in the stomach. It is used to treat gastroesophageal reflux disease (GERD), inflammation of the esophagus, and Zollinger-Ellison syndrome. This medicine may be used for other purposes; ask your health care provider or pharmacist if you have questions.  COMMON BRAND NAME(S): Protonix What should I tell my health care provider before I take this medicine? They need to know if you have any of these conditions: -liver disease -low levels of magnesium in the blood -an unusual or  allergic reaction to omeprazole, lansoprazole, pantoprazole, rabeprazole, other medicines, foods, dyes, or preservatives -pregnant or trying to get pregnant -breast-feeding How should I use this medicine? Take this medicine by mouth. Swallow the tablets whole with a drink of water. Follow the directions on the prescription label. Do not crush, break, or chew. Take your medicine at regular intervals. Do not take your medicine more often than directed. Talk to your pediatrician regarding the use of this medicine in children. While this drug may be prescribed for children as young as 5 years for selected conditions, precautions do apply. Overdosage: If you think you have taken too much of this medicine contact a poison control center or emergency room at once. NOTE: This medicine is only for you. Do not share this medicine with others. What if I miss a dose? If you miss a dose, take it as soon as you can. If it is almost time for your next dose, take only that dose. Do not take double or extra doses. What may interact with this medicine? Do not take this medicine with any of the following medications: -atazanavir -nelfinavir This medicine may also interact with the following medications: -ampicillin -delavirdine -digoxin -diuretics -iron salts -medicines for fungal infections like ketoconazole, itraconazole and voriconazole -warfarin This list may not describe all possible interactions. Give your health care provider a list of all the medicines, herbs, non-prescription drugs, or dietary supplements you use. Also tell them if you smoke, drink alcohol, or use illegal drugs. Some items may interact with your medicine. What should I watch for while using this medicine? It can take several days before your stomach pain gets better. Check with your doctor or health care professional if your condition does not start to get better, or if it gets worse. You may need blood work done while you are taking  this medicine. What side effects may I notice from receiving this medicine? Side effects that you should report to your doctor or health care professional as soon as possible: -allergic reactions like skin rash, itching or hives, swelling of the face, lips, or tongue -bone, muscle or joint pain -breathing problems -chest pain or chest tightness -dark yellow or brown urine -dizziness -fast, irregular heartbeat -feeling faint or lightheaded -fever or sore throat -muscle spasm -palpitations -redness, blistering, peeling or loosening of the skin, including inside the mouth -seizures -tremors -unusual bleeding or bruising -unusually weak or tired -yellowing of the eyes or skin Side effects that usually do not require medical attention (Report these to your doctor or health care professional if they continue or are bothersome.): -constipation -diarrhea -dry mouth -headache -nausea This list may not describe all possible side effects. Call your doctor for medical advice about side effects. You may report side effects to FDA at 1-800-FDA-1088. Where should I keep my medicine? Keep out of the reach of children. Store at room temperature between 15 and 30 degrees C (59 and 86 degrees F). Protect from light and moisture. Throw away any unused medicine after the expiration date. NOTE: This sheet is a summary. It may not cover all possible information. If you have questions about this medicine, talk to your doctor, pharmacist, or health care provider.  2015, Elsevier/Gold Standard. (2011-12-27 16:40:16)  Ondansetron tablets What is  this medicine? ONDANSETRON (on DAN se tron) is used to treat nausea and vomiting caused by chemotherapy. It is also used to prevent or treat nausea and vomiting after surgery. This medicine may be used for other purposes; ask your health care provider or pharmacist if you have questions. COMMON BRAND NAME(S): Zofran What should I tell my health care provider  before I take this medicine? They need to know if you have any of these conditions: -heart disease -history of irregular heartbeat -liver disease -low levels of magnesium or potassium in the blood -an unusual or allergic reaction to ondansetron, granisetron, other medicines, foods, dyes, or preservatives -pregnant or trying to get pregnant -breast-feeding How should I use this medicine? Take this medicine by mouth with a glass of water. Follow the directions on your prescription label. Take your doses at regular intervals. Do not take your medicine more often than directed. Talk to your pediatrician regarding the use of this medicine in children. Special care may be needed. Overdosage: If you think you have taken too much of this medicine contact a poison control center or emergency room at once. NOTE: This medicine is only for you. Do not share this medicine with others. What if I miss a dose? If you miss a dose, take it as soon as you can. If it is almost time for your next dose, take only that dose. Do not take double or extra doses. What may interact with this medicine? Do not take this medicine with any of the following medications: -apomorphine -certain medicines for fungal infections like fluconazole, itraconazole, ketoconazole, posaconazole, voriconazole -cisapride -dofetilide -dronedarone -pimozide -thioridazine -ziprasidone This medicine may also interact with the following medications: -carbamazepine -certain medicines for depression, anxiety, or psychotic disturbances -fentanyl -linezolid -MAOIs like Carbex, Eldepryl, Marplan, Nardil, and Parnate -methylene blue (injected into a vein) -other medicines that prolong the QT interval (cause an abnormal heart rhythm) -phenytoin -rifampicin -tramadol This list may not describe all possible interactions. Give your health care provider a list of all the medicines, herbs, non-prescription drugs, or dietary supplements you use.  Also tell them if you smoke, drink alcohol, or use illegal drugs. Some items may interact with your medicine. What should I watch for while using this medicine? Check with your doctor or health care professional right away if you have any sign of an allergic reaction. What side effects may I notice from receiving this medicine? Side effects that you should report to your doctor or health care professional as soon as possible: -allergic reactions like skin rash, itching or hives, swelling of the face, lips or tongue -breathing problems -confusion -dizziness -fast or irregular heartbeat -feeling faint or lightheaded, falls -fever and chills -loss of balance or coordination -seizures -sweating -swelling of the hands or feet -tightness in the chest -tremors -unusually weak or tired Side effects that usually do not require medical attention (report to your doctor or health care professional if they continue or are bothersome): -constipation or diarrhea -headache This list may not describe all possible side effects. Call your doctor for medical advice about side effects. You may report side effects to FDA at 1-800-FDA-1088. Where should I keep my medicine? Keep out of the reach of children. Store between 2 and 30 degrees C (36 and 86 degrees F). Throw away any unused medicine after the expiration date. NOTE: This sheet is a summary. It may not cover all possible information. If you have questions about this medicine, talk to your doctor, pharmacist, or health care  provider.  2015, Elsevier/Gold Standard. (2012-12-04 16:27:45)   Emergency Department Resource Guide 1) Find a Doctor and Pay Out of Pocket Although you won't have to find out who is covered by your insurance plan, it is a good idea to ask around and get recommendations. You will then need to call the office and see if the doctor you have chosen will accept you as a new patient and what types of options they offer for patients  who are self-pay. Some doctors offer discounts or will set up payment plans for their patients who do not have insurance, but you will need to ask so you aren't surprised when you get to your appointment.  2) Contact Your Local Health Department Not all health departments have doctors that can see patients for sick visits, but many do, so it is worth a call to see if yours does. If you don't know where your local health department is, you can check in your phone book. The CDC also has a tool to help you locate your state's health department, and many state websites also have listings of all of their local health departments.  3) Find a [facility] If your illness is not likely to be very severe or complicated, you may want to try a walk in clinic. These are popping up all over the country in pharmacies, drugstores, and shopping centers. They're usually staffed by nurse practitioners or physician assistants that have been trained to treat common illnesses and complaints. They're usually fairly quick and inexpensive. However, if you have serious medical issues or chronic medical problems, these are probably not your best option.  No Primary Care Doctor: - Call Health Connect at  [phone] - they can help you locate a primary care doctor that  accepts your insurance, provides certain services, etc. - Physician Referral Service- [phone]  Chronic Pain Problems: Organization         Address  Phone   Notes  [facility]  [phone] Patients need to be referred by their primary care doctor.   Medication Assistance: Organization         Address  Phone   Notes  [facility] Medication [facility] [address] [place], [place] [address] [phone] --Must be a resident of [facility] -- Must have NO insurance coverage whatsoever (no Medicaid/ Medicare, etc.) -- The pt. MUST have a primary care doctor that directs their care regularly and follows  them in the community   [facility]ist  [phone]   [employer]  [phone]    Agencies that provide inexpensive medical care: Organization         Address  Phone   Notes  [place]  [phone]   [facility]    [phone]   [facility] [place], [place] [address] [phone]   [place] [address] [place] [phone]   Planned Parenthood    [phone]   [facility]    [phone]   [address] [place], [place] Phone:  [phone], Fax:  [phone] Hours of Operation:  9 am - 6 pm, M-F.  Also accepts Medicaid/Medicare and self-pay.  [facility] for [place], [address], [place] Phone: [phone], Fax: [phone]. Hours of Operation:  8:30 am - 5:30 pm, M-F.  Also accepts Medicaid and self-pay.  [facility] [place] [address], [place] Phone: [phone]   [place], [place], [place] [phone], Ext. [phone] Mondays & Thursdays: 7-9 AM.  First 15 patients are seen on a first come, first serve basis.    [place] Providers:  Organization         Address  Phone   Notes  [facility] [address], [place] [phone] Also accepts self-pay patients.  [facility] [address], [place]  [phone]   [place], [address], [place] [phone]   [facility] [address], [place] [phone]   Lucianne Lei [address], [place]   [phone] Only accepts [place] Access [place] patients after they have their name applied to their card.   Self-Pay (no insurance) in [facility]:  Organization         Address  Phone   Notes  Sickle Cell  Patients, [facility] [place] [phone]   [facility] [place] [phone]   [facility] [place]  [place], [place], [place] [phone]   [facility]/Dr. Osei-Bonsu  [address], [place] or [address], [place] [phone] Phone number for both [place] and [place] locations is the same.  Urgent Medical and [facility] [address] [place] [phone]   [facility] [address], [place] or [address] [phone]   [facility] [address], [place] [phone], phone; [phone], fax Sees patients 1st and 3rd Saturday of every month.  Must not qualify for public or private insurance (i.e. Medicaid, Medicare, [place] Health Choice, Veterans' Benefits)  Household income should be no more than 200% of the poverty level The clinic cannot treat you if you are pregnant or think you are pregnant  Sexually transmitted diseases are not treated at the clinic.    Dental Care: Organization         Address  Phone  Notes  [facility] Department of [facility] [place] [phone] Accepts children up to age 40 who are enrolled in [place] or [place]; pregnant women with a Medicaid card; and children who have applied for Medicaid or [place] Health Choice, but were declined, whose parents can pay a reduced fee at time of service.  [facility] Department of [facility]  [address], [place] [phone] Accepts children up to age 76 who are enrolled in [place] or [place]; pregnant women with a Medicaid card; and children who have applied for Medicaid or [place] Health Choice, but were declined, whose parents can pay a reduced fee at time of service.  Center Adult Dental Access  PROGRAM  [place] [phone] Patients are seen by appointment only. Walk-ins are not accepted. [place] will see patients 79 years of age and older. Monday - Tuesday (8am-5pm) Most Wednesdays (8:30-5pm) $30 per visit, cash only  [facility] Adult Dental Access PROGRAM  [address], [facility] [phone] Patients are seen by appointment only. Walk-ins are not accepted. [place]  will see patients 46 years of age and older. One Wednesday Evening (Monthly: Volunteer Based).  $30 per visit, cash only  [place]  [phone] for adults; Children under age 53, call Graduate Pediatric Dentistry at [phone]. Children aged 100-14, please call [phone] to request a pediatric application.  Dental services are provided in all areas of dental care including fillings, crowns and bridges, complete and partial dentures, implants, gum treatment, root canals, and extractions. Preventive care is also provided. Treatment is provided to both adults and children. Patients are selected via a lottery and there is often a waiting list.   [facility] [address], [place]  [phone] [email or web address]   [facility] [address] [place], [place] [phone], Ext. [phone] Second and Fourth Thursday of each month, opens at 6:30 AM; Clinic ends at 9 AM.  Patients are seen on a first-come first-served basis, and a limited number are seen during each clinic.   [facility]  [address] Hillard Danker [place], [place] [phone]   Eligibility Requirements You must have lived in [place], [place], or [place] counties for at least the last three months.   You cannot be eligible for state or federal sponsored [employer], including [employer], [place], or [employer].   You generally cannot be eligible for healthcare insurance through your employer.    How to apply: Eligibility  screenings are held every Tuesday and Wednesday afternoon from 1:00 pm until 4:00 pm. You do not need an appointment for the interview!  [facility] [address] [place], [place]   [place]  [place] Department  [place]  [phone]    Behavioral Health Resources in the Community: Intensive Outpatient Programs Organization         Address  Phone  Notes  [place]. [address], [place], [place] [phone]   [facility] Outpatient [address] [place], [place]   ADS: Alcohol & Drug Svcs [address], [place], [place]   [place] [address],  [place], [place] or [phone]   Substance Abuse Resources Organization         Address  Phone  Notes  Alcohol and Drug Services  [phone]   [place]  [phone]   [facility] [place]   Chinita Pester  [phone]   Residential & Outpatient Substance Abuse Program  [phone]   Psychological Services Organization         Address  Phone  Notes  [facility] [place]  [place]  [phone]   [place] [address], [place] or [phone]    Mobile Crisis Teams Organization         Address  Phone  Notes  [facility], Mobile Crisis Care Unit  [phone]   [facility]  [address] [place], [place]   [place] Levels [address] [place] [phone]    Self-Help/Support Groups Organization         Address  Phone             Notes  [place]. of [place] - variety of support groups  [place] Call for more information  Narcotics Anonymous (NA), [facility]aring [facility] [address],  [place]  2 meetings at  this location   Residential Treatment Programs Organization         Address  Phone  Notes  [facility] [address],    [place]  [phone]   [facility] [place]  [address] [place] [address], [place], [place]   [place], [place] [phone] Admissions: 8am-3pm M-F  Incentives Substance [place] [address],    [place], [place] [phone]   [facility] [address] [place], [place], [place]   [facility] [address],  [place], [place]   [facility] Programs - Intensive Outpatient [address] Kristeen [address], [place], [address]   [facility] [address]  [place], [place] [phone] or [phone]   [facility]idential Treatment Services (RTS) [address] [place], [place] Accepts Medicaid  [facility] [place] [address]  [place] [phone] Substance Abuse/Addiction Treatment   [facility] Organization         Address  Phone  Notes  [facility]  [phone]   Domenic Schwab, PhD [address] Arlis Porta [place], [place]   [phone] or [phone]   [place], [place] [phone]   [facility] [address], [place], [place] [phone] Insurance/Medicaid/sponsorship through [facility] and Families [address], [address]                                    [place], [place] [phone] [place] [address], [place] [phone]    Dr. Adele Schilder  [phone]   [facility] Dept. 1) [address], [place] 2) [place] 3)  [place] [address], [place] [phone]  [phone]   [place] [phone] or [phone] (After Hours)

## 2014-11-18 MED FILL — Ondansetron HCl Tab 4 MG: ORAL | Qty: 4 | Status: AC

## 2015-04-07 ENCOUNTER — Encounter (HOSPITAL_COMMUNITY): Payer: Self-pay | Admitting: Emergency Medicine

## 2015-04-07 ENCOUNTER — Emergency Department (HOSPITAL_COMMUNITY)
Admission: EM | Admit: 2015-04-07 | Discharge: 2015-04-07 | Disposition: A | Discharge status: Home / Self Care | Payer: Self-pay | Attending: Emergency Medicine | Admitting: Emergency Medicine

## 2015-04-07 DIAGNOSIS — R112 Nausea with vomiting, unspecified: Secondary | ICD-10-CM | POA: Insufficient documentation

## 2015-04-07 DIAGNOSIS — R6883 Chills (without fever): Secondary | ICD-10-CM | POA: Insufficient documentation

## 2015-04-07 DIAGNOSIS — R109 Unspecified abdominal pain: Secondary | ICD-10-CM | POA: Insufficient documentation

## 2015-04-07 DIAGNOSIS — K219 Gastro-esophageal reflux disease without esophagitis: Secondary | ICD-10-CM | POA: Insufficient documentation

## 2015-04-07 DIAGNOSIS — Z87891 Personal history of nicotine dependence: Secondary | ICD-10-CM | POA: Insufficient documentation

## 2015-04-07 DIAGNOSIS — I10 Essential (primary) hypertension: Secondary | ICD-10-CM | POA: Insufficient documentation

## 2015-04-07 DIAGNOSIS — Z79899 Other long term (current) drug therapy: Secondary | ICD-10-CM | POA: Insufficient documentation

## 2015-04-07 DIAGNOSIS — Z91040 Latex allergy status: Secondary | ICD-10-CM | POA: Insufficient documentation

## 2015-04-07 DIAGNOSIS — R1013 Epigastric pain: Secondary | ICD-10-CM | POA: Insufficient documentation

## 2015-04-07 DIAGNOSIS — Z8669 Personal history of other diseases of the nervous system and sense organs: Secondary | ICD-10-CM | POA: Insufficient documentation

## 2015-04-07 LAB — URINALYSIS, ROUTINE W REFLEX MICROSCOPIC
BILIRUBIN URINE: NEGATIVE
Glucose, UA: NEGATIVE mg/dL
KETONES UR: NEGATIVE mg/dL
Leukocytes, UA: NEGATIVE
Nitrite: NEGATIVE
PROTEIN: NEGATIVE mg/dL
Specific Gravity, Urine: >1.03 — ABNORMAL HIGH (ref 1.005–1.030)
pH: 5.5 (ref 5.0–8.0)

## 2015-04-07 LAB — CBC
HCT: 39.6 % (ref 36.0–46.0)
Hemoglobin: 13 g/dL (ref 12.0–15.0)
MCH: 30.1 pg (ref 26.0–34.0)
MCHC: 32.8 g/dL (ref 30.0–36.0)
MCV: 91.7 fL (ref 78.0–100.0)
PLATELETS: 249 10*3/uL (ref 150–400)
RBC: 4.32 MIL/uL (ref 3.87–5.11)
RDW: 12.5 % (ref 11.5–15.5)
WBC: 10.6 10*3/uL — ABNORMAL HIGH (ref 4.0–10.5)

## 2015-04-07 LAB — COMPREHENSIVE METABOLIC PANEL
ALBUMIN: 4.2 g/dL (ref 3.5–5.0)
ALK PHOS: 56 U/L (ref 38–126)
ALT: 10 U/L — AB (ref 14–54)
ANION GAP: 8 (ref 5–15)
AST: 16 U/L (ref 15–41)
BILIRUBIN TOTAL: 0.8 mg/dL (ref 0.3–1.2)
BUN: 16 mg/dL (ref 6–20)
CALCIUM: 9.4 mg/dL (ref 8.9–10.3)
CO2: 28 mmol/L (ref 22–32)
CREATININE: 0.67 mg/dL (ref 0.44–1.00)
Chloride: 102 mmol/L (ref 101–111)
GFR calc Af Amer: >60 mL/min (ref >60)
GFR calc non Af Amer: >60 mL/min (ref >60)
GLUCOSE: 93 mg/dL (ref 65–99)
Potassium: 3.8 mmol/L (ref 3.5–5.1)
Sodium: 138 mmol/L (ref 135–145)
TOTAL PROTEIN: 7.9 g/dL (ref 6.5–8.1)

## 2015-04-07 LAB — URINE MICROSCOPIC-ADD ON

## 2015-04-07 LAB — LIPASE, BLOOD: Lipase: 26 U/L (ref 11–51)

## 2015-04-07 MED ORDER — SODIUM CHLORIDE 0.9 % IV BOLUS (SEPSIS)
1000.0000 mL | Freq: Once | INTRAVENOUS | Status: AC
  Administered 2015-04-07: 1000 mL via INTRAVENOUS

## 2015-04-07 MED ORDER — ONDANSETRON HCL 4 MG/2ML IJ SOLN
4.0000 mg | Freq: Once | INTRAMUSCULAR | Status: AC
  Administered 2015-04-07: 4 mg via INTRAVENOUS
  Filled 2015-04-07: qty 2

## 2015-04-07 MED ORDER — MORPHINE SULFATE (PF) 4 MG/ML IV SOLN
4.0000 mg | Freq: Once | INTRAVENOUS | Status: AC
  Administered 2015-04-07: 4 mg via INTRAVENOUS
  Filled 2015-04-07: qty 1

## 2015-04-07 MED ORDER — ONDANSETRON 8 MG PO TBDP: 8.0000 mg | ORAL_TABLET | Freq: Three times a day (TID) | ORAL | Status: DC | PRN

## 2015-04-07 NOTE — ED Notes (Signed)
MD at bedside. 

## 2015-04-07 NOTE — ED Notes (Signed)
Pt reports mid abdominal pain that began yesterday. States the n/v and chills began today. Pt states she unable to tolerate anything PO at this time.

## 2015-04-07 NOTE — Discharge Instructions (Signed)

## 2015-04-07 NOTE — ED Provider Notes (Signed)
CSN: [id number]     Arrival date & time 04/07/15  1153 History  By signing my name below, I, Terressa Koyanagi, attest that this documentation has been prepared under the direction and in the presence of Jola Schmidt, MD. Electronically Signed: Terressa Koyanagi, ED Scribe. 04/07/2015. 12:38 PM.   Chief Complaint  Patient presents with  . Abdominal Pain   The history is provided by the patient. No language interpreter was used.   PCP: Marval Regal, MD HPI Comments: Diana Blevins is a 50 y.o. female who presents to the Emergency Department complaining of atraumatic, worsening, moderate, sore mid abd pain onset last night and worsening today. Associated Sx include n/v and chills onset today-- pt specifies she is unable to tolerate anything PO including water. Pt denies Hx of gallstones, cholecystectomy. Pt denies chest pain, SOB.    Past Medical History  Diagnosis Date  . Hypertension   . Acid reflux   . Neuropathy [facility])    Past Surgical History  Procedure Laterality Date  . Oophorectomy    . Cosmetic surgery     Family History  Problem Relation Age of Onset  . Hypertension Mother   . Diabetes Mother   . Hypertension Father    Social History  Substance Use Topics  . Smoking status: Former Smoker    Quit date: 02/06/2001  . Smokeless tobacco: Never Used  . Alcohol Use: Yes     Comment: occas   OB History    Gravida Para Term Preterm AB TAB SAB Ectopic Multiple Living            0     Review of Systems  A complete 10 system review of systems was obtained and all systems are negative except as noted in the HPI and PMH.   Allergies  Bee venom; Dust mite extract; Latex; and Peanut-containing drug products  Home Medications   Prior to Admission medications   Medication Sig Start Date End Date Taking? Authorizing Provider  EPINEPHrine (EPI-PEN) 0.3 mg/0.3 mL SOAJ Stab the outer thigh and come to ED immediately if acute allergic reactions occur. 10/07/12   Lily Kocher,  PA-C  lisinopril-hydrochlorothiazide (PRINZIDE,ZESTORETIC) 20-12.5 MG per tablet Take 1 tablet by mouth daily.     Historical Provider, MD  ondansetron (ZOFRAN) 4 MG tablet Take 1 tablet (4 mg total) by mouth every 6 (six) hours as needed for nausea. [id number]   Delora Fuel, MD  ondansetron (ZOFRAN) 4 MG tablet Take 1 tablet (4 mg total) by mouth every 6 (six) hours as needed. [id number]   Delora Fuel, MD  pantoprazole (PROTONIX) 40 MG tablet Take 1 tablet (40 mg total) by mouth daily. [id number]   Delora Fuel, MD   Triage Vitals: BP 158/92 mmHg  Pulse 87  Temp(Src) 98.4 F (36.9 C) (Oral)  Resp 18  Ht 5\' 5"  (1.651 m)  Wt 275 lb (124.739 kg)  BMI 45.76 kg/m2  SpO2 100%  LMP 04/05/2015 Physical Exam  Constitutional: She is oriented to person, place, and time. She appears well-developed and well-nourished. No distress.  HENT:  Head: Normocephalic and atraumatic.  Eyes: EOM are normal.  Neck: Normal range of motion.  Cardiovascular: Normal rate, regular rhythm and normal heart sounds.   Pulmonary/Chest: Effort normal and breath sounds normal.  Abdominal: Soft. She exhibits no distension. There is tenderness in the epigastric area. There is no rebound and no guarding.  Musculoskeletal: Normal range of motion.  Neurological: She is alert and oriented to person, place, and  time.  Skin: Skin is warm and dry.  Psychiatric: She has a normal mood and affect. Judgment normal.  Nursing note and vitals reviewed.  ED Course  Procedures (including critical care time) DIAGNOSTIC STUDIES: Oxygen Saturation is 100% on ra, nl by my interpretation.    COORDINATION OF CARE: 12:34 PM: Discussed treatment plan which includes meds and labs with pt at bedside; patient verbalizes understanding and agrees with treatment plan.  Labs Review Labs Reviewed  COMPREHENSIVE METABOLIC PANEL - Abnormal; Notable for the following:    ALT 10 (*)    All other components within normal limits  CBC - Abnormal; Notable for  the following:    WBC 10.6 (*)    All other components within normal limits  URINALYSIS, ROUTINE W REFLEX MICROSCOPIC (NOT AT [facility]) - Abnormal; Notable for the following:    Specific Gravity, Urine >1.030 (*)    Hgb urine dipstick LARGE (*)    All other components within normal limits  URINE MICROSCOPIC-ADD ON - Abnormal; Notable for the following:    Squamous Epithelial / LPF 0-5 (*)    Bacteria, UA FEW (*)    All other components within normal limits  LIPASE, BLOOD  I have personally reviewed and evaluated these lab results as part of my medical decision-making.  MDM   Final diagnoses:  Abdominal pain, unspecified abdominal location  Nausea and vomiting, vomiting of unspecified type    Pt improved at this time. Repeat abdominal exam benign. Vitals normal. Nausea controlled. Dc home with pcp follow up  I personally performed the services described in this documentation, which was scribed in my presence. The recorded information has been reviewed and is accurate.       Jola Schmidt, MD 04/07/15 [phone]

## 2015-04-26 ENCOUNTER — Encounter (HOSPITAL_COMMUNITY): Payer: Self-pay | Admitting: *Deleted

## 2015-04-26 ENCOUNTER — Emergency Department (HOSPITAL_COMMUNITY)
Admission: EM | Admit: 2015-04-26 | Discharge: 2015-04-26 | Disposition: A | Discharge status: Home / Self Care | Payer: Self-pay | Attending: Emergency Medicine | Admitting: Emergency Medicine

## 2015-04-26 DIAGNOSIS — W228XXA Striking against or struck by other objects, initial encounter: Secondary | ICD-10-CM | POA: Insufficient documentation

## 2015-04-26 DIAGNOSIS — I10 Essential (primary) hypertension: Secondary | ICD-10-CM | POA: Insufficient documentation

## 2015-04-26 DIAGNOSIS — Y9389 Activity, other specified: Secondary | ICD-10-CM | POA: Insufficient documentation

## 2015-04-26 DIAGNOSIS — R21 Rash and other nonspecific skin eruption: Secondary | ICD-10-CM | POA: Insufficient documentation

## 2015-04-26 DIAGNOSIS — Z8719 Personal history of other diseases of the digestive system: Secondary | ICD-10-CM | POA: Insufficient documentation

## 2015-04-26 DIAGNOSIS — S0501XA Injury of conjunctiva and corneal abrasion without foreign body, right eye, initial encounter: Secondary | ICD-10-CM | POA: Insufficient documentation

## 2015-04-26 DIAGNOSIS — Z79899 Other long term (current) drug therapy: Secondary | ICD-10-CM | POA: Insufficient documentation

## 2015-04-26 DIAGNOSIS — K029 Dental caries, unspecified: Secondary | ICD-10-CM | POA: Insufficient documentation

## 2015-04-26 DIAGNOSIS — K047 Periapical abscess without sinus: Secondary | ICD-10-CM | POA: Insufficient documentation

## 2015-04-26 DIAGNOSIS — Y9289 Other specified places as the place of occurrence of the external cause: Secondary | ICD-10-CM | POA: Insufficient documentation

## 2015-04-26 DIAGNOSIS — Z8669 Personal history of other diseases of the nervous system and sense organs: Secondary | ICD-10-CM | POA: Insufficient documentation

## 2015-04-26 DIAGNOSIS — Z91040 Latex allergy status: Secondary | ICD-10-CM | POA: Insufficient documentation

## 2015-04-26 DIAGNOSIS — Y998 Other external cause status: Secondary | ICD-10-CM | POA: Insufficient documentation

## 2015-04-26 DIAGNOSIS — Z87891 Personal history of nicotine dependence: Secondary | ICD-10-CM | POA: Insufficient documentation

## 2015-04-26 MED ORDER — TETRACAINE HCL 0.5 % OP SOLN
2.0000 [drp] | Freq: Once | OPHTHALMIC | Status: DC
  Filled 2015-04-26: qty 4

## 2015-04-26 MED ORDER — FLUORESCEIN SODIUM 1 MG OP STRP
1.0000 | ORAL_STRIP | Freq: Once | OPHTHALMIC | Status: DC
  Filled 2015-04-26: qty 1

## 2015-04-26 MED ORDER — KETOROLAC TROMETHAMINE 0.5 % OP SOLN
1.0000 [drp] | Freq: Once | OPHTHALMIC | Status: AC
  Administered 2015-04-26: 1 [drp] via OPHTHALMIC
  Filled 2015-04-26: qty 5

## 2015-04-26 MED ORDER — TOBRAMYCIN 0.3 % OP SOLN
1.0000 [drp] | Freq: Once | OPHTHALMIC | Status: AC
  Administered 2015-04-26: 1 [drp] via OPHTHALMIC
  Filled 2015-04-26: qty 5

## 2015-04-26 MED ORDER — TRAMADOL HCL 50 MG PO TABS: 50.0000 mg | ORAL_TABLET | Freq: Four times a day (QID) | ORAL | Status: DC | PRN

## 2015-04-26 MED ORDER — AMOXICILLIN 500 MG PO CAPS: 500.0000 mg | ORAL_CAPSULE | Freq: Three times a day (TID) | ORAL | Status: DC

## 2015-04-26 NOTE — ED Notes (Signed)
Pt could read visual chart completely, with no complaints, pt states she has to wear reading glasses

## 2015-04-26 NOTE — ED Notes (Signed)
Pt states she had a piece of bamboo hit her in her right on Saturday. Pt is able to see out of that eye with no difficulties. Pt states her pain has moved into her left face with a rash noted under her left eye.

## 2015-04-26 NOTE — ED Notes (Signed)
Patient given discharge instruction, verbalized understand. Patient ambulatory out of the department.  

## 2015-04-26 NOTE — ED Provider Notes (Signed)
CSN: [id number]     Arrival date & time 04/26/15  1400 History  By signing my name below, I, Hilda Lias, attest that this documentation has been prepared under the direction and in the presence of Marquis Down, PA-C.  Electronically Signed: Hilda Lias, ED Scribe. 04/26/2015. 3:33 PM.    Chief Complaint  Patient presents with  . Eye Pain  . Facial Pain      Patient is a 50 y.o. female presenting with eye pain. The history is provided by the patient. No language interpreter was used.  Eye Pain This is a new problem. The current episode started more than 2 days ago. The problem occurs constantly. The problem has not changed since onset.Pertinent negatives include no headaches. Nothing aggravates the symptoms. Nothing relieves the symptoms. She has tried nothing for the symptoms.   HPI Comments: Diana Blevins is a 50 y.o. female who presents to the Emergency Department complaining of constant, worsening right eye pain that has been present for three days. Pt states she is able to see out of the eye with no difficulty. Pt states a piece of bamboo hit her in the right eye, and she states she flushed it out when the incident occurred. Pt also reports with a constant, worsening dental abscess with associated dental pain to a left-sided maxillary tooth that has been present for a few days. Pt denies seeing a dentist PTA for her dental pain. Pt reports associated right sided facial pain and swelling as well, Pt denies fever, chills, dizziness, neck pain, contact use, difficulty swallowing or any other symptoms.   Past Medical History  Diagnosis Date  . Hypertension   . Acid reflux   . Neuropathy [facility])    Past Surgical History  Procedure Laterality Date  . Oophorectomy    . Cosmetic surgery     Family History  Problem Relation Age of Onset  . Hypertension Mother   . Diabetes Mother   . Hypertension Father    Social History  Substance Use Topics  . Smoking status: Former Smoker     Quit date: 02/06/2001  . Smokeless tobacco: Never Used  . Alcohol Use: Yes     Comment: occas   OB History    Gravida Para Term Preterm AB TAB SAB Ectopic Multiple Living            0     Review of Systems  Constitutional: Negative for fever, chills and appetite change.  HENT: Positive for dental problem and facial swelling. Negative for congestion, sore throat and trouble swallowing.   Eyes: Positive for pain and redness. Negative for photophobia and visual disturbance.  Gastrointestinal: Negative for nausea and vomiting.  Musculoskeletal: Negative for neck pain and neck stiffness.  Skin: Positive for rash.  Neurological: Negative for dizziness, facial asymmetry and headaches.  Hematological: Negative for adenopathy.  All other systems reviewed and are negative.     Allergies  Bee venom; Dust mite extract; Latex; and Peanut-containing drug products  Home Medications   Prior to Admission medications   Medication Sig Start Date End Date Taking? Authorizing Provider  atenolol (TENORMIN) 25 MG tablet Take 25 mg by mouth daily.   Yes Historical Provider, MD  EPINEPHrine (EPI-PEN) 0.3 mg/0.3 mL SOAJ Stab the outer thigh and come to ED immediately if acute allergic reactions occur. 10/07/12  Yes Lily Kocher, PA-C  loratadine (ALLERGY RELIEF) 10 MG tablet Take 10 mg by mouth daily as needed for allergies.   Yes Historical Provider,  MD  ondansetron (ZOFRAN ODT) 8 MG disintegrating tablet Take 1 tablet (8 mg total) by mouth every 8 (eight) hours as needed for nausea or vomiting. 04/07/15  Yes Jola Schmidt, MD  tetrahydrozoline-zinc (VISINE-AC) 0.05-0.25 % ophthalmic solution Apply 2 drops to eye 3 (three) times daily as needed.   Yes Historical Provider, MD   BP 175/102 mmHg  Pulse 63  Temp(Src) 98.4 F (36.9 C) (Oral)  Resp 18  Ht 5\' 5"  (1.651 m)  Wt 274 lb (124.286 kg)  BMI 45.60 kg/m2  SpO2 100%  LMP 04/05/2015 Physical Exam  Constitutional: She is oriented to person,  place, and time. She appears well-developed and well-nourished.  HENT:  Head: Normocephalic and atraumatic.  Mouth/Throat: Oropharynx is clear and moist.  Tenderness and dental caries of the left upper cuspid and premolar with tenderness of adjacent gingiva No obvious abscess or trismus   Eyes: EOM are normal. Pupils are equal, round, and reactive to light. Lids are everted and swept, no foreign bodies found. Right eye exhibits no chemosis and no discharge. No foreign body present in the right eye. Left eye exhibits no chemosis. Right conjunctiva is injected. Right conjunctiva has no hemorrhage. Right eye exhibits normal extraocular motion and no nystagmus.  Slit lamp exam:      The right eye shows corneal abrasion and fluorescein uptake. The right eye shows no corneal ulcer, no foreign body and no hyphema.    Small corneal abrasion at the 4 o'clock position.  No FB's, no hyphema, or evidence of penetrating trauma  Neck: Normal range of motion. Neck supple.  Cardiovascular: Normal rate, regular rhythm and intact distal pulses.   Pulmonary/Chest: Effort normal and breath sounds normal. No respiratory distress.  Musculoskeletal: Normal range of motion.  Lymphadenopathy:    She has no cervical adenopathy.  Neurological: She is alert and oriented to person, place, and time. Coordination normal.  Skin: Skin is warm and dry.  Psychiatric: She has a normal mood and affect.  Nursing note and vitals reviewed.   ED Course  Procedures (including critical care time)  DIAGNOSTIC STUDIES: Oxygen Saturation is 100% on room air, normal by my interpretation.    COORDINATION OF CARE: 3:23 PM Discussed treatment plan with pt at bedside and pt agreed to plan.   Labs Review Labs Reviewed - No data to display  Imaging Review No results found. I have personally reviewed and evaluated these images and lab results as part of my medical decision-making.   EKG Interpretation None         Visual  Acuity  Right Eye Distance: 20/10 Left Eye Distance: 20/10 Bilateral Distance:    Right Eye Near:   Left Eye Near:    Bilateral Near:       MDM   Final diagnoses:  Dental abscess  Corneal abrasion, right, initial encounter    ketorolac and tobramycin drops dispensed.  Agrees to ophth and dental f/u.  No evidence of penetrating trauma to the eye.  No facial edema.  rx for amoxil and tramadol for dental abscess  I personally performed the services described in this documentation, which was scribed in my presence. The recorded information has been reviewed and is accurate.     Bufford Lope 04/28/15 [id number]  Milton Ferguson, MD 04/28/15 [phone]

## 2015-04-26 NOTE — Discharge Instructions (Signed)
Corneal Abrasion The cornea is the clear covering at the front and center of the eye. When you look at the colored portion of the eye, you are looking through the cornea. It is a thin tissue made up of layers. The top layer is the most sensitive layer. A corneal abrasion happens if this layer is scratched or an injury causes it to come off.  HOME CARE  You may be given drops or a medicated cream. Use the medicine as told by your doctor.  A pressure patch may be put over the eye. If this is done, follow your doctor's instructions for when to remove the patch. Do not drive or use machines while the eye patch is on. Judging distances is hard to do with a patch on.  See your doctor for a follow-up exam if you are told to do so. It is very important that you keep this appointment. GET HELP IF:   You have pain, are sensitive to light, and have a scratchy feeling in one eye or both eyes.  Your pressure patch keeps getting loose. You can blink your eye under the patch.  You have fluid coming from your eye or the lids stick together in the morning.  You have the same symptoms in the morning that you did with the first abrasion. This could be days, weeks, or months after the first abrasion healed.   This information is not intended to replace advice given to you by your health care provider. Make sure you discuss any questions you have with your health care provider.   Document Released: 08/16/2007 Document Revised: 11/18/2014 Document Reviewed: 11/04/2012 Elsevier Interactive Patient Education 2016 [place] Abscess A dental abscess is pus in or around a tooth. HOME CARE  Take medicines only as told by your dentist.  If you were prescribed antibiotic medicine, finish all of it even if you start to feel better.  Rinse your mouth (gargle) often with salt water.  Do not drive or use heavy machinery, like a lawn mower, while taking pain medicine.  Do not apply heat to the outside  of your mouth.  Keep all follow-up visits as told by your dentist. This is important. GET HELP IF:  Your pain is worse, and medicine does not help. GET HELP RIGHT AWAY IF:  You have a fever or chills.  Your symptoms suddenly get worse.  You have a very bad headache.  You have problems breathing or swallowing.  You have trouble opening your mouth.  You have puffiness (swelling) in your neck or around your eye.   This information is not intended to replace advice given to you by your health care provider. Make sure you discuss any questions you have with your health care provider.   Document Released: 07/14/2014 Document Reviewed: 07/14/2014 Elsevier Interactive Patient Education [employer].   Emergency Department Resource Guide 1) Find a Doctor and Pay Out of Pocket Although you won't have to find out who is covered by your insurance plan, it is a good idea to ask around and get recommendations. You will then need to call the office and see if the doctor you have chosen will accept you as a new patient and what types of options they offer for patients who are self-pay. Some doctors offer discounts or will set up payment plans for their patients who do not have insurance, but you will need to ask so you aren't surprised when you get to your appointment.  2) Contact Your Local Health Department Not all health departments have doctors that can see patients for sick visits, but many do, so it is worth a call to see if yours does. If you don't know where your local health department is, you can check in your phone book. The CDC also has a tool to help you locate your state's health department, and many state websites also have listings of all of their local health departments.  3) Find a [facility] If your illness is not likely to be very severe or complicated, you may want to try a walk in clinic. These are popping up all over the country in pharmacies, drugstores, and  shopping centers. They're usually staffed by nurse practitioners or physician assistants that have been trained to treat common illnesses and complaints. They're usually fairly quick and inexpensive. However, if you have serious medical issues or chronic medical problems, these are probably not your best option.  No Primary Care Doctor: - Call Health Connect at  [phone] - they can help you locate a primary care doctor that  accepts your insurance, provides certain services, etc. - Physician Referral Service- [phone]  Chronic Pain Problems: Organization         Address  Phone   Notes  [facility]  [phone] Patients need to be referred by their primary care doctor.   Medication Assistance: Organization         Address  Phone   Notes  [facility] Medication [address] [place], [place] [address] [phone] --Must be a resident of [facility] -- Must have NO insurance coverage whatsoever (no Medicaid/ Medicare, etc.) -- The pt. MUST have a primary care doctor that directs their care regularly and follows them in the community   MedAssist  [phone]   [employer]  [phone]    Agencies that provide inexpensive medical care: Organization         Address  Phone   Notes  [place]  [phone]   [facility]    [phone]   [facility] [place], [place] [address] [phone]   [place] [address] [place] [phone]   Planned Parenthood    [phone]   [facility]    [phone]   [place] and [place] [address], [place] Phone:  [phone], Fax:  [phone] Hours of Operation:  9 am - 6 pm, M-F.  Also accepts Medicaid/Medicare and self-pay.  [facility] for [place] [address], [address], [place] Phone: [phone],  Fax: [phone]. Hours of Operation:  8:30 am - 5:30 pm, M-F.  Also accepts Medicaid and self-pay.  [facility] [address] [place] Phone: [phone]   [place], [place], [place] [phone], Ext. [phone] Mondays & Thursdays: 7-9 AM.  First 15 patients are seen on a first come, first serve basis.    [place] Providers:  Organization         Address  Phone   Notes  [facility] [address], [place] [phone] Also accepts self-pay patients.  [place], [place]  [phone]   [place], [address]  [place] [phone]   [place] [address], [place] [phone]   Lucianne Lei [address], [place]   [phone] Only accepts [place] Access [place] patients after they have their name applied to their card.   Self-Pay (no insurance) in [facility]:  Organization         Address  Phone   Notes  Sickle Cell Patients, [facility] [place] [phone]   [facility] Urgent Care [place] [phone]   [facility] [place]  [place], [place], [place] [phone]   [facility]/Dr. Osei-Bonsu  [address], [place] or [address], [employer] [phone] Phone number for both [place] and [place] locations is the same.  [facility]gent Medical and [facility] [address] [place] [phone]   [facility] [address] [place] or [address] [phone]   [facility] [address], [place] [phone], phone; [phone], fax Sees patients 1st and 3rd Saturday of every month.  Must not qualify for public or private  insurance (i.e. Medicaid, Medicare, [place] Health Choice, Veterans' Benefits)  Household income should be no more than 200% of the poverty level The clinic cannot treat you if you are pregnant or think you are pregnant  Sexually transmitted diseases are not treated at the clinic.    Dental Care: Organization         Address  Phone  Notes  [facility] Department of [facility] [phone] Accepts children up to age 82 who are enrolled in [place] or [place]; pregnant women with a Medicaid card; and children who have applied for Medicaid or [place] Health Choice, but were declined, whose parents can pay a reduced fee at time of service.  [facility] Department of [facility]  [address], [place] [phone] Accepts children up to age 2 who are enrolled in [place] or [place]; pregnant women with a Medicaid card; and children who have applied for Medicaid or [place] Health Choice, but were declined, whose parents can pay a reduced fee at time of service.  [place] Adult Dental Access PROGRAM  [place] [phone] Patients are seen by appointment only. Walk-ins are not accepted. [place] will see patients 16 years of age and older. Monday - Tuesday (8am-5pm) Most Wednesdays (8:30-5pm) $30 per visit, cash only  [facility] Adult Dental Access PROGRAM  [address], [facility] [phone] Patients are seen by appointment only. Walk-ins are not accepted. [place] will see patients 12 years of age and older. One Wednesday Evening (Monthly: Volunteer Based).  $30 per visit, cash only  [place]  [phone] for adults; Children under age 9, call Graduate Pediatric Dentistry at [phone]. Children aged 32-14, please call [phone] to request a pediatric application.  Dental services are provided in all areas of dental care  including fillings, crowns and bridges, complete and partial dentures, implants, gum treatment, root canals, and extractions. Preventive care is also provided. Treatment is provided to both adults and children. Patients are selected via a lottery and there is often a waiting list.   [facility] [address],  [place]  [phone] [email or web address]   [facility] [address] [place], [place] [phone], Ext. [phone] Second and Fourth Thursday of each month, opens at 6:30 AM; Clinic ends at 9 AM.  Patients are seen on a first-come first-served basis, and a limited number are seen during each clinic.   [facility]  [address] Hillard Danker [place], [place] [phone]   Eligibility Requirements You must have lived in [place], [place], or [place] counties for at least the last three months.   You cannot be eligible for state or federal sponsored [employer], including [employer], [place], or [employer].   You generally cannot be eligible for healthcare insurance through your employer.    How to apply: Eligibility screenings are held every Tuesday and Wednesday afternoon from 1:00 pm until 4:00 pm. You do not need an appointment for the interview!  [facility] [address], [place], [place]   [place]  [place] Department  [place]  [phone]    Behavioral Health Resources in the Community: Intensive Outpatient Programs Organization         Address  Phone  Notes  [place]. [address], [place], [place] [phone]   [facility] Outpatient [address], [place], [place]   ADS: Alcohol & Drug Svcs [address] [place], [place]   [place] [address]  [place], [place] or [phone]     Substance Abuse Resources Organization         Address  Phone  Notes  Alcohol and Drug Services  [phone]   [place]  [phone]   [facility] [place]   Chinita Pester  [phone]   Residential & Outpatient Substance Abuse Program  [phone]   Psychological Services Organization         Address  Phone  Notes  [facility] [place]  [place]  [phone]   [place] [address] [place] or [phone]    Mobile Crisis Teams Organization         Address  Phone  Notes  [facility], Mobile Crisis Care Unit  [phone]   [facility] Psychotherapeutic Services  [address] [place], [place]   [place] [address] [place] [phone]    Self-Help/Support Groups Organization         Address  Phone             Notes  [place]. of [place] - variety of support groups  [place] Call for more information  Narcotics Anonymous (NA), [facility]aring [facility] [address], [employer] [place]  2 meetings at this location   Special educational needs teacher         Address  Phone  Notes  [facility] Residential Treatment [place],    [place]  [phone]   [facility]  [address] [place] [phone], [place], [place]   [place], [place] [phone] Admissions: 8am-3pm M-F  Incentives Substance [place] [address]    [place], [place] [phone]   [facility] [address] [place], [place], [place]   [facility] [address],  [place], [place]  [facility]ight Programs - Intensive Outpatient [address], [place], [place]   [facility] ([place] [address]  [place], [place] [phone] or [phone]   Residential Treatment  Services (RTS) [address], [place], [place] Accepts Medicaid  [facility] [place] [address],  [place] [phone] Substance Abuse/Addiction Treatment   [facility] Organization         Address  Phone  Notes  [facility]  [phone]   Domenic Schwab, PhD [address] Arlis Porta [place], [place]   [phone] or [phone]   [place], [place] [phone]   [place] [address], [place], [place] [phone] Insurance/Medicaid/sponsorship through [facility] and Families [address], [address] [place]                                    [place], [place] [phone] [place] [address], [place] [phone]    Dr. Adele Schilder  [phone]   [facility] [facility] Dept. 1) [address], [place] 2) [place] 3)  [address], [place] [phone]  [phone]   [place] [phone] or [phone] (After Hours)

## 2015-06-03 ENCOUNTER — Encounter (HOSPITAL_COMMUNITY): Payer: Self-pay | Admitting: Emergency Medicine

## 2015-06-03 ENCOUNTER — Emergency Department (HOSPITAL_COMMUNITY): Payer: Self-pay

## 2015-06-03 ENCOUNTER — Emergency Department (HOSPITAL_COMMUNITY)
Admission: EM | Admit: 2015-06-03 | Discharge: 2015-06-03 | Disposition: A | Discharge status: Home / Self Care | Payer: Self-pay | Attending: Emergency Medicine | Admitting: Emergency Medicine

## 2015-06-03 DIAGNOSIS — I10 Essential (primary) hypertension: Secondary | ICD-10-CM | POA: Insufficient documentation

## 2015-06-03 DIAGNOSIS — Z87891 Personal history of nicotine dependence: Secondary | ICD-10-CM | POA: Insufficient documentation

## 2015-06-03 DIAGNOSIS — J069 Acute upper respiratory infection, unspecified: Secondary | ICD-10-CM | POA: Insufficient documentation

## 2015-06-03 DIAGNOSIS — B9789 Other viral agents as the cause of diseases classified elsewhere: Secondary | ICD-10-CM

## 2015-06-03 MED ORDER — BENZONATATE 100 MG PO CAPS: 200.0000 mg | ORAL_CAPSULE | Freq: Three times a day (TID) | ORAL | Status: DC | PRN

## 2015-06-03 MED ORDER — PROMETHAZINE-CODEINE 6.25-10 MG/5ML PO SYRP: 5.0000 mL | ORAL_SOLUTION | ORAL | Status: DC | PRN

## 2015-06-03 MED ORDER — BENZONATATE 100 MG PO CAPS
200.0000 mg | ORAL_CAPSULE | Freq: Once | ORAL | Status: AC
  Administered 2015-06-03: 200 mg via ORAL
  Filled 2015-06-03: qty 2

## 2015-06-03 NOTE — Discharge Instructions (Signed)
Upper Respiratory Infection, Adult Most upper respiratory infections (URIs) are a viral infection of the air passages leading to the lungs. A URI affects the nose, throat, and upper air passages. The most common type of URI is nasopharyngitis and is typically referred to as "the common cold." URIs run their course and usually go away on their own. Most of the time, a URI does not require medical attention, but sometimes a bacterial infection in the upper airways can follow a viral infection. This is called a secondary infection. Sinus and middle ear infections are common types of secondary upper respiratory infections. Bacterial pneumonia can also complicate a URI. A URI can worsen asthma and chronic obstructive pulmonary disease (COPD). Sometimes, these complications can require emergency medical care and may be life threatening.  CAUSES Almost all URIs are caused by viruses. A virus is a type of germ and can spread from one person to another.  RISKS FACTORS You may be at risk for a URI if:   You smoke.   You have chronic heart or lung disease.  You have a weakened defense (immune) system.   You are very young or very old.   You have nasal allergies or asthma.  You work in crowded or poorly ventilated areas.  You work in health care facilities or schools. SIGNS AND SYMPTOMS  Symptoms typically develop 2-3 days after you come in contact with a cold virus. Most viral URIs last 7-10 days. However, viral URIs from the influenza virus (flu virus) can last 14-18 days and are typically more severe. Symptoms may include:   Runny or stuffy (congested) nose.   Sneezing.   Cough.   Sore throat.   Headache.   Fatigue.   Fever.   Loss of appetite.   Pain in your forehead, behind your eyes, and over your cheekbones (sinus pain).  Muscle aches.  DIAGNOSIS  Your health care provider may diagnose a URI by:  Physical exam.  Tests to check that your symptoms are not due to  another condition such as:  Strep throat.  Sinusitis.  Pneumonia.  Asthma. TREATMENT  A URI goes away on its own with time. It cannot be cured with medicines, but medicines may be prescribed or recommended to relieve symptoms. Medicines may help:  Reduce your fever.  Reduce your cough.  Relieve nasal congestion. HOME CARE INSTRUCTIONS   Take medicines only as directed by your health care provider.   Gargle warm saltwater or take cough drops to comfort your throat as directed by your health care provider.  Use a warm mist humidifier or inhale steam from a shower to increase air moisture. This may make it easier to breathe.  Drink enough fluid to keep your urine clear or pale yellow.   Eat soups and other clear broths and maintain good nutrition.   Rest as needed.   Return to work when your temperature has returned to normal or as your health care provider advises. You may need to stay home longer to avoid infecting others. You can also use a face mask and careful hand washing to prevent spread of the virus.  Increase the usage of your inhaler if you have asthma.   Do not use any tobacco products, including cigarettes, chewing tobacco, or electronic cigarettes. If you need help quitting, ask your health care provider. PREVENTION  The best way to protect yourself from getting a cold is to practice good hygiene.   Avoid oral or hand contact with people with cold   symptoms.   Wash your hands often if contact occurs.  There is no clear evidence that vitamin C, vitamin E, echinacea, or exercise reduces the chance of developing a cold. However, it is always recommended to get plenty of rest, exercise, and practice good nutrition.  SEEK MEDICAL CARE IF:   You are getting worse rather than better.   Your symptoms are not controlled by medicine.   You have chills.  You have worsening shortness of breath.  You have brown or red mucus.  You have yellow or brown nasal  discharge.  You have pain in your face, especially when you bend forward.  You have a fever.  You have swollen neck glands.  You have pain while swallowing.  You have white areas in the back of your throat. SEEK IMMEDIATE MEDICAL CARE IF:   You have severe or persistent:  Headache.  Ear pain.  Sinus pain.  Chest pain.  You have chronic lung disease and any of the following:  Wheezing.  Prolonged cough.  Coughing up blood.  A change in your usual mucus.  You have a stiff neck.  You have changes in your:  Vision.  Hearing.  Thinking.  Mood. MAKE SURE YOU:   Understand these instructions.  Will watch your condition.  Will get help right away if you are not doing well or get worse.   This information is not intended to replace advice given to you by your health care provider. Make sure you discuss any questions you have with your health care provider.   Document Released: 08/23/2000 Document Revised: 07/14/2014 Document Reviewed: 06/04/2013 Elsevier Interactive Patient Education 2016 Elsevier Inc.  

## 2015-06-03 NOTE — ED Provider Notes (Signed)
CSN: [id number]     Arrival date & time 06/03/15  1430 History   First MD Initiated Contact with Patient 06/03/15 1518     Chief Complaint  Patient presents with  . Cough     (Consider location/radiation/quality/duration/timing/severity/associated sxs/prior Treatment) The history is provided by the patient.   Diana Blevins is a 50 y.o. female  4 day history of uri type symptoms which includes nasal congestion with clear rhinorrhea,  low grade fever and nonproductive cough which is triggered by an uncontrolled throat tickle sensation.  Symptoms do not include shortness of breath, chest pain,  Nausea, vomiting or diarrhea, but endorses coughing so hard she has nearly gagged and vomited.  The patient has taken theraflu prior to arrival with no significant improvement in symptoms.    Past Medical History  Diagnosis Date  . Hypertension   . Acid reflux   . Neuropathy [facility])    Past Surgical History  Procedure Laterality Date  . Oophorectomy    . Cosmetic surgery     Family History  Problem Relation Age of Onset  . Hypertension Mother   . Diabetes Mother   . Hypertension Father    Social History  Substance Use Topics  . Smoking status: Former Smoker    Quit date: 02/06/2001  . Smokeless tobacco: Never Used  . Alcohol Use: Yes     Comment: occas   OB History    Gravida Para Term Preterm AB TAB SAB Ectopic Multiple Living            0     Review of Systems  Constitutional: Positive for fever and chills.  HENT: Positive for congestion and rhinorrhea. Negative for ear pain, sinus pressure, sore throat, trouble swallowing and voice change.   Eyes: Negative for discharge.  Respiratory: Positive for cough. Negative for shortness of breath, wheezing and stridor.   Cardiovascular: Negative for chest pain.  Gastrointestinal: Negative for abdominal pain.  Genitourinary: Negative.       Allergies  Bee venom; Dust mite extract; Latex; and Peanut-containing drug  products  Home Medications   Prior to Admission medications   Medication Sig Start Date End Date Taking? Authorizing Provider  amoxicillin (AMOXIL) 500 MG capsule Take 1 capsule (500 mg total) by mouth 3 (three) times daily. For 10 days 04/26/15   Tammy Triplett, PA-C  atenolol (TENORMIN) 25 MG tablet Take 25 mg by mouth daily.    Historical Provider, MD  benzonatate (TESSALON) 100 MG capsule Take 2 capsules (200 mg total) by mouth 3 (three) times daily as needed. 06/03/15   Evalee Jefferson, PA-C  EPINEPHrine (EPI-PEN) 0.3 mg/0.3 mL SOAJ Stab the outer thigh and come to ED immediately if acute allergic reactions occur. 10/07/12   Lily Kocher, PA-C  loratadine (ALLERGY RELIEF) 10 MG tablet Take 10 mg by mouth daily as needed for allergies.    Historical Provider, MD  ondansetron (ZOFRAN ODT) 8 MG disintegrating tablet Take 1 tablet (8 mg total) by mouth every 8 (eight) hours as needed for nausea or vomiting. 04/07/15   Jola Schmidt, MD  promethazine-codeine [facility] WITH CODEINE) 6.25-10 MG/5ML syrup Take 5 mLs by mouth every 4 (four) hours as needed for cough. 06/03/15   Evalee Jefferson, PA-C  tetrahydrozoline-zinc (VISINE-AC) 0.05-0.25 % ophthalmic solution Apply 2 drops to eye 3 (three) times daily as needed.    Historical Provider, MD  traMADol (ULTRAM) 50 MG tablet Take 1 tablet (50 mg total) by mouth every 6 (six) hours as needed.  04/26/15   Tammy Triplett, PA-C   BP 153/86 mmHg  Pulse 59  Temp(Src) 98.4 F (36.9 C) (Oral)  Resp 16  Ht 5\' 5"  (1.651 m)  Wt 124.286 kg  BMI 45.60 kg/m2  SpO2 100%  LMP 05/13/2015 (Approximate) Physical Exam  Constitutional: She is oriented to person, place, and time. She appears well-developed and well-nourished.  HENT:  Head: Normocephalic and atraumatic.  Right Ear: Tympanic membrane and ear canal normal.  Left Ear: Tympanic membrane and ear canal normal.  Nose: Mucosal edema and rhinorrhea present.  Mouth/Throat: Uvula is midline, oropharynx is clear and  moist and mucous membranes are normal. No oropharyngeal exudate, posterior oropharyngeal edema, posterior oropharyngeal erythema or tonsillar abscesses.  Eyes: Conjunctivae are normal.  Cardiovascular: Normal rate and normal heart sounds.   Pulmonary/Chest: Effort normal. No respiratory distress. She has no wheezes. She has no rales.  Abdominal: Soft. There is no tenderness.  Musculoskeletal: Normal range of motion.  Neurological: She is alert and oriented to person, place, and time.  Skin: Skin is warm and dry. No rash noted.  Psychiatric: She has a normal mood and affect.    ED Course  Procedures (including critical care time) Labs Review Labs Reviewed - No data to display  Imaging Review Dg Chest 2 View  06/03/2015  CLINICAL DATA:  Nonobstructive cough 4 days EXAM: CHEST  2 VIEW COMPARISON:  05/04/2014 FINDINGS: The heart size and mediastinal contours are within normal limits. Both lungs are clear. The visualized skeletal structures are unremarkable. IMPRESSION: No active cardiopulmonary disease. Electronically Signed   By: Skipper Cliche M.D.   On: 06/03/2015 15:56   I have personally reviewed and evaluated these images as part of my medical decision-making.   EKG Interpretation None      MDM   Final diagnoses:  Viral URI with cough   Pt advised rest, increased fluids, tessalon prescribed, phenergan with codeine when not working or at school. Advised recheck for any worsened sx.  Exam and h/o c/w viral uri.      Evalee Jefferson, PA-C 06/03/15 2124  Fredia Sorrow, MD 06/04/15 [phone]

## 2015-06-03 NOTE — ED Notes (Signed)
Pt reports cough and fevers since Monday, no fever in triage.  Pt has been taking theraflu.

## 2015-07-04 ENCOUNTER — Emergency Department (HOSPITAL_COMMUNITY): Payer: Self-pay

## 2015-07-04 ENCOUNTER — Emergency Department (HOSPITAL_COMMUNITY)
Admission: EM | Admit: 2015-07-04 | Discharge: 2015-07-04 | Disposition: A | Discharge status: Home / Self Care | Payer: Self-pay | Attending: Emergency Medicine | Admitting: Emergency Medicine

## 2015-07-04 ENCOUNTER — Encounter (HOSPITAL_COMMUNITY): Payer: Self-pay | Admitting: Emergency Medicine

## 2015-07-04 DIAGNOSIS — M76821 Posterior tibial tendinitis, right leg: Secondary | ICD-10-CM | POA: Insufficient documentation

## 2015-07-04 DIAGNOSIS — M779 Enthesopathy, unspecified: Secondary | ICD-10-CM | POA: Insufficient documentation

## 2015-07-04 DIAGNOSIS — Z79899 Other long term (current) drug therapy: Secondary | ICD-10-CM | POA: Insufficient documentation

## 2015-07-04 DIAGNOSIS — I10 Essential (primary) hypertension: Secondary | ICD-10-CM | POA: Insufficient documentation

## 2015-07-04 DIAGNOSIS — M778 Other enthesopathies, not elsewhere classified: Secondary | ICD-10-CM

## 2015-07-04 DIAGNOSIS — Z87891 Personal history of nicotine dependence: Secondary | ICD-10-CM | POA: Insufficient documentation

## 2015-07-04 MED ORDER — NAPROXEN 500 MG PO TABS: 500.0000 mg | ORAL_TABLET | Freq: Two times a day (BID) | ORAL | Status: DC

## 2015-07-04 MED ORDER — TRAMADOL HCL 50 MG PO TABS: 50.0000 mg | ORAL_TABLET | Freq: Four times a day (QID) | ORAL | Status: DC | PRN
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
## 2015-07-04 NOTE — Discharge Instructions (Signed)
Take naproxen as prescribed for pain. Take tramadol for severe pain. Get orthotic arch supports, wear them in supportive shoes. Do ice massage as discussed. Avoid wearing any braces or supports. Start doing the foot and ankle exercises as described below. Follow-up with your doctor.   Posterior Tibial Tendon Tendinitis With Rehab Tendonitis is a condition that is characterized by inflammation of a tendon or the lining (sheath) that surrounds it. The inflammation is usually caused by damage to the tendon, such as a tendon tear (strain). Sprains are classified into three categories. Grade 1 sprains cause pain, but the tendon is not lengthened. Grade 2 sprains include a lengthened ligament due to the ligament being stretched or partially ruptured. With grade 2 sprains there is still function, although the function may be diminished. Grade 3 sprains are characterized by a complete tear of the tendon or muscle, and function is usually impaired. Posterior tibialis tendonitis is tendonitis of the posterior tibial tendon, which attaches muscles of the lower leg to the foot. The posterior tibial tendon is located in the back of the ankle and helps the body straighten (plantar flex) and rotate inward (medially rotate) the ankle. SYMPTOMS   Pain, tenderness, swelling, warmth, and/or redness over the back of the inner ankle at the posterior tibial tendon or the inner part of the mid-foot.  Pain that worsens with plantar flexion or medial rotation of the ankle.  A crackling sound (crepitation) when the tendon is moved or touched. CAUSES  Posterior tibial tendonitis occurs when damage to the posterior tibial tendon starts an inflammatory response. Common mechanisms of injury include:  Degenerative (occurs with aging) processes that weaken the tendon and make it more susceptible to injury.  Stress placed on the tendon from an increase in the intensity, frequency, or duration of training.  Direct trauma to the  ankle.  Returning to activity before a previous ankle injury is allowed to heal. RISK INCREASES WITH:  Activities that involve repetitive and/or stressful plantar flexion (jumping, kicking, or running up/down hills).  Poor strength and flexibility.  Flat feet.  Previous injury to the foot, ankle, or leg. PREVENTION   Warm up and stretch properly before activity.  Allow for adequate recovery between workouts.  Maintain physical fitness:  Strength, flexibility, and endurance.  Cardiovascular fitness.  Learn and use proper technique. When possible, have a coach correct improper technique.  Complete rehabilitation from a previous foot, ankle, or leg injury.  If you have flat feet, wear arch supports (orthotics). PROGNOSIS  If treated properly, the symptoms of tendonitis usually resolve within 6 weeks. This period may be shorter for injuries caused by direct trauma. RELATED COMPLICATIONS   Prolonged healing time, if improperly treated or reinjured.  Recurrent symptoms that result in a chronic problem.  Partial or complete tendon tear (rupture) requiring surgery. TREATMENT  Treatment initially involves the use of ice and medication to help reduce pain and inflammation. The use of strengthening and stretching exercises may help reduce pain with activity. These exercises may be performed at home or with referral to a therapist. Often times, your caregiver will recommend immobilizing the ankle to allow the tendon to heal. If you have flat feet, you may be advised to wear orthotic arch supports. If symptoms persist for greater than 6 months despite nonsurgical (conservative) treatment, then surgery may be recommended. MEDICATION   If pain medication is necessary, then nonsteroidal anti-inflammatory medications, such as aspirin and ibuprofen, or other minor pain relievers, such as acetaminophen, are often recommended.  Do not take pain medication for 7 days before  surgery.  Prescription pain relievers may be given if deemed necessary by your caregiver. Use only as directed and only as much as you need.  Corticosteroid injections may be given by your caregiver. These injections should be reserved for the most serious cases because they may only be given a certain number of times. HEAT AND COLD  Cold treatment (icing) relieves pain and reduces inflammation. Cold treatment should be applied for 10 to 15 minutes every 2 to 3 hours for inflammation and pain and immediately after any activity that aggravates your symptoms. Use ice packs or massage the area with a piece of ice (ice massage).  Heat treatment may be used prior to performing the stretching and strengthening activities prescribed by your caregiver, physical therapist, or athletic trainer. Use a heat pack or soak the injury in warm water. SEEK MEDICAL CARE IF:  Treatment seems to offer no benefit, or the condition worsens.  Any medications produce adverse side effects. EXERCISES RANGE OF MOTION (ROM) AND STRETCHING EXERCISES - Posterior Tibial Tendon Tendinitis These exercises may help you when beginning to rehabilitate your injury. Your symptoms may resolve with or without further involvement from your physician, physical therapist or athletic trainer. While completing these exercises, remember:   Restoring tissue flexibility helps normal motion to return to the joints. This allows healthier, less painful movement and activity.  An effective stretch should be held for at least 30 seconds.  A stretch should never be painful. You should only feel a gentle lengthening or release in the stretched tissue. RANGE OF MOTION - Ankle Plantar Flexion   Sit with your right / left leg crossed over your opposite knee.  Use your opposite hand to pull the top of your foot and toes toward you.  You should feel a gentle stretch on the top of your foot/ankle. Hold this position for __________ seconds. Repeat  __________ times. Complete this exercise __________ times per day.  RANGE OF MOTION - Ankle Eversion   Sit with your right / left ankle crossed over your opposite knee.  Grip your foot with your opposite hand, placing your thumb on the top of your foot and your fingers across the bottom of your foot.  Gently push your foot downward with a slight rotation so your littlest toes rise slightly.  You should feel a gentle stretch on the inside of your ankle. Hold the stretch for __________ seconds. Repeat __________ times. Complete this exercise __________ times per day.  RANGE OF MOTION - Ankle Inversion   Sit with your right / left ankle crossed over your opposite knee.  Grip your foot with your opposite hand, placing your thumb on the bottom of your foot and your fingers across the top of your foot.  Gently pull your foot so the smallest toe comes toward you and your thumb pushes the inside of the ball of your foot away from you.  You should feel a gentle stretch on the outside of your ankle. Hold the stretch for __________ seconds. Repeat __________ times. Complete this exercise __________ times per day.  RANGE OF MOTION - Dorsi/Plantar Flexion  While sitting with your right / left knee straight, draw the top of your foot upward by flexing your ankle. Then reverse the motion, pointing your toes downward.  Hold each position for __________ seconds.  After completing your first set of exercises, repeat this exercise with your knee bent. Repeat __________ times. Complete this exercise  __________ times per day.  RANGE OF MOTION - Ankle Alphabet  Imagine your right / left big toe is a pen.  Keeping your hip and knee still, write out the entire alphabet with your "pen." Make the letters as large as you can without increasing any discomfort. Repeat __________ times. Complete this exercise __________ times per day.  STRETCH - Gastrocsoleus   Sit with your right / left leg extended. Holding  onto both ends of a belt or towel, loop it around the ball of your foot.  Keeping your right / left ankle and foot relaxed and your knee straight, pull your foot and ankle toward you using the belt/towel.  You should feel a gentle stretch behind your calf or knee. Hold this position for __________ seconds. Repeat __________ times. Complete this exercise __________ times per day.  STRETCH - Gastroc, Standing   Place hands on wall.  Extend right / left leg, keeping the front knee somewhat bent.  Slightly point your toes inward on your back foot.  Keeping your right / left heel on the floor and your knee straight, shift your weight toward the wall, not allowing your back to arch.  You should feel a gentle stretch in the right / left calf. Hold this position for __________ seconds. Repeat __________ times. Complete this stretch __________ times per day. STRETCH - Soleus, Standing   Place hands on wall.  Extend right / left leg, keeping the other knee somewhat bent.  Slightly point your toes inward on your back foot.  Keep your right / left heel on the floor, bend your back knee, and slightly shift your weight over the back leg so that you feel a gentle stretch deep in your back calf.  Hold this position for __________ seconds. Repeat __________ times. Complete this stretch __________ times per day. STRENGTHENING EXERCISES - Posterior Tibial Tendon Tendinitis These exercises may help you when beginning to rehabilitate your injury. They may resolve your symptoms with or without further involvement from your physician, physical therapist, or athletic trainer. While completing these exercises, remember:   Muscles can gain both the endurance and the strength needed for everyday activities through controlled exercises.  Complete these exercises as instructed by your physician, physical therapist, or athletic trainer. Progress the resistance and repetitions only as guided. STRENGTH -  Dorsiflexors  Secure a rubber exercise band/tubing to a fixed object (i.e., table, pole) and loop the other end around your right / left foot.  Sit on the floor facing the fixed object. The band/tubing should be slightly tense when your foot is relaxed.  Slowly draw your foot back toward you using your ankle and toes.  Hold this position for __________ seconds. Slowly release the tension in the band and return your foot to the starting position. Repeat __________ times. Complete this exercise __________ times per day.  STRENGTH - Towel Curls  Sit in a chair positioned on a non-carpeted surface.  Place your foot on a towel, keeping your heel on the floor.  Pull the towel toward your heel by only curling your toes. Keep your heel on the floor.  If instructed by your physician, physical therapist, or athletic trainer, add ____________________ at the end of the towel. Repeat __________ times. Complete this exercise __________ times per day. STRENGTH - Ankle Eversion   Secure one end of a rubber exercise band/tubing to a fixed object (table, pole). Loop the other end around your foot just before your toes.  Place your fists between  your knees. This will focus your strengthening at your ankle.  Drawing the band/tubing across your opposite foot, slowly pull your little toe out and up. Make sure the band/tubing is positioned to resist the entire motion.  Hold this position for __________ seconds.  Have your muscles resist the band/tubing as it slowly pulls your foot back to the starting position. Repeat __________ times. Complete this exercise __________ times per day.  STRENGTH - Ankle Inversion   Secure one end of a rubber exercise band/tubing to a fixed object (table, pole). Loop the other end around your foot just before your toes.  Place your fists between your knees. This will focus your strengthening at your ankle.  Slowly, pull your big toe up and in, making sure the band/tubing  is positioned to resist the entire motion.  Hold this position for __________ seconds.  Have your muscles resist the band/tubing as it slowly pulls your foot back to the starting position. Repeat __________ times. Complete this exercises __________ times per day.    This information is not intended to replace advice given to you by your health care provider. Make sure you discuss any questions you have with your health care provider.   Document Released: 02/27/2005 Document Revised: 07/14/2014 Document Reviewed: 06/11/2008 Elsevier Interactive Patient Education [employer].

## 2015-07-04 NOTE — ED Notes (Signed)
Pt reports RT leg pain. States pain radiates from shin to ankle. Pt states she was helping family move recently. Pt increases with movement. Pt ambulatory.

## 2015-08-14 ENCOUNTER — Emergency Department (HOSPITAL_COMMUNITY)
Admission: EM | Admit: 2015-08-14 | Discharge: 2015-08-14 | Disposition: A | Discharge status: Home / Self Care | Payer: Self-pay | Attending: Emergency Medicine | Admitting: Emergency Medicine

## 2015-08-14 ENCOUNTER — Encounter (HOSPITAL_COMMUNITY): Payer: Self-pay | Admitting: Emergency Medicine

## 2015-08-14 DIAGNOSIS — X101XXA Contact with hot food, initial encounter: Secondary | ICD-10-CM | POA: Insufficient documentation

## 2015-08-14 DIAGNOSIS — Y93G9 Activity, other involving cooking and grilling: Secondary | ICD-10-CM | POA: Insufficient documentation

## 2015-08-14 DIAGNOSIS — Z23 Encounter for immunization: Secondary | ICD-10-CM | POA: Insufficient documentation

## 2015-08-14 DIAGNOSIS — T23232A Burn of second degree of multiple left fingers (nail), not including thumb, initial encounter: Secondary | ICD-10-CM | POA: Insufficient documentation

## 2015-08-14 DIAGNOSIS — T23102A Burn of first degree of left hand, unspecified site, initial encounter: Secondary | ICD-10-CM

## 2015-08-14 DIAGNOSIS — F17210 Nicotine dependence, cigarettes, uncomplicated: Secondary | ICD-10-CM | POA: Insufficient documentation

## 2015-08-14 DIAGNOSIS — Y999 Unspecified external cause status: Secondary | ICD-10-CM | POA: Insufficient documentation

## 2015-08-14 DIAGNOSIS — Y929 Unspecified place or not applicable: Secondary | ICD-10-CM | POA: Insufficient documentation

## 2015-08-14 DIAGNOSIS — I10 Essential (primary) hypertension: Secondary | ICD-10-CM | POA: Insufficient documentation

## 2015-08-14 MED ORDER — HYDROCODONE-ACETAMINOPHEN 5-325 MG PO TABS: 1.0000 | ORAL_TABLET | ORAL | Status: DC | PRN

## 2015-08-14 MED ORDER — TETANUS-DIPHTH-ACELL PERTUSSIS 5-2.5-18.5 LF-MCG/0.5 IM SUSP
0.5000 mL | Freq: Once | INTRAMUSCULAR | Status: AC
  Administered 2015-08-14: 0.5 mL via INTRAMUSCULAR
  Filled 2015-08-14: qty 0.5

## 2015-08-14 MED ORDER — SILVER SULFADIAZINE 1 % EX CREA
TOPICAL_CREAM | Freq: Once | CUTANEOUS | Status: AC
  Administered 2015-08-14: 18:00:00 via TOPICAL
  Filled 2015-08-14: qty 50
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
## 2015-08-14 NOTE — ED Provider Notes (Signed)
CSN: [id number]     Arrival date & time 08/14/15  1645 History   First MD Initiated Contact with Patient 08/14/15 1702     Chief Complaint  Patient presents with  . Hand Burn     (Consider location/radiation/quality/duration/timing/severity/associated sxs/prior Treatment) HPI   Diana Blevins is a 50 y.o. female who accidentally brushed the top of the fingers of the left hand into some hot grease, while cooking, this afternoon. No other injuries. Unknown tetanus status. There are no other known modifying factors.   Past Medical History  Diagnosis Date  . Hypertension   . Acid reflux   . Neuropathy [facility])    Past Surgical History  Procedure Laterality Date  . Oophorectomy    . Cosmetic surgery     Family History  Problem Relation Age of Onset  . Hypertension Mother   . Diabetes Mother   . Hypertension Father    Social History  Substance Use Topics  . Smoking status: Current Some Day Smoker -- 0.01 packs/day    Types: Cigarettes  . Smokeless tobacco: Never Used  . Alcohol Use: Yes     Comment: occas   OB History    Gravida Para Term Preterm AB TAB SAB Ectopic Multiple Living            0     Review of Systems  All other systems reviewed and are negative.     Allergies  Bee venom; Dust mite extract; Latex; and Peanut-containing drug products  Home Medications   Prior to Admission medications   Medication Sig Start Date End Date Taking? Authorizing Provider  amoxicillin (AMOXIL) 500 MG capsule Take 1 capsule (500 mg total) by mouth 3 (three) times daily. For 10 days Patient not taking: Reported on 07/04/2015 04/26/15   Tammy Triplett, PA-C  atenolol (TENORMIN) 25 MG tablet Take 25 mg by mouth daily.    Historical Provider, MD  benzonatate (TESSALON) 100 MG capsule Take 2 capsules (200 mg total) by mouth 3 (three) times daily as needed. Patient not taking: Reported on 07/04/2015 06/03/15   Evalee Jefferson, PA-C  EPINEPHrine (EPI-PEN) 0.3 mg/0.3 mL SOAJ Stab the  outer thigh and come to ED immediately if acute allergic reactions occur. 10/07/12   Lily Kocher, PA-C  HYDROcodone-acetaminophen (NORCO) 5-325 MG tablet Take 1 tablet by mouth every 4 (four) hours as needed. 08/14/15   Daleen Bo, MD  loratadine (ALLERGY RELIEF) 10 MG tablet Take 10 mg by mouth daily as needed for allergies.    Historical Provider, MD  naproxen (NAPROSYN) 500 MG tablet Take 1 tablet (500 mg total) by mouth 2 (two) times daily. 07/04/15   Tatyana Kirichenko, PA-C  ondansetron (ZOFRAN ODT) 8 MG disintegrating tablet Take 1 tablet (8 mg total) by mouth every 8 (eight) hours as needed for nausea or vomiting. Patient not taking: Reported on 07/04/2015 04/07/15   Jola Schmidt, MD  promethazine-codeine [facility] WITH CODEINE) 6.25-10 MG/5ML syrup Take 5 mLs by mouth every 4 (four) hours as needed for cough. Patient not taking: Reported on 07/04/2015 06/03/15   Evalee Jefferson, PA-C  tetrahydrozoline-zinc (VISINE-AC) 0.05-0.25 % ophthalmic solution Apply 2 drops to eye 3 (three) times daily as needed (dry eyes).     Historical Provider, MD  traMADol (ULTRAM) 50 MG tablet Take 1 tablet (50 mg total) by mouth every 6 (six) hours as needed. Patient not taking: Reported on 07/04/2015 04/26/15   Tammy Triplett, PA-C  traMADol (ULTRAM) 50 MG tablet Take 1 tablet (50 mg  total) by mouth every 6 (six) hours as needed. 07/04/15   Tatyana Kirichenko, PA-C   BP 184/91 mmHg  Pulse 74  Temp(Src) 98.6 F (37 C) (Oral)  Resp 18  Ht 5\' 5"  (1.651 m)  Wt 274 lb (124.286 kg)  BMI 45.60 kg/m2  SpO2 100%  LMP 07/24/2015 Physical Exam  Constitutional: She is oriented to person, place, and time. She appears well-developed and well-nourished.  HENT:  Head: Normocephalic and atraumatic.  Right Ear: External ear normal.  Left Ear: External ear normal.  Eyes: Conjunctivae and EOM are normal. Pupils are equal, round, and reactive to light.  Neck: Normal range of motion and phonation normal. Neck supple.   Cardiovascular: Normal rate.   Pulmonary/Chest: Effort normal. She exhibits no bony tenderness.  Musculoskeletal: Normal range of motion.  Normal range of motion left hand.  Neurological: She is alert and oriented to person, place, and time. No cranial nerve deficit or sensory deficit. She exhibits normal muscle tone. Coordination normal.  Skin: Skin is warm, dry and intact.  Superficial blistering dorsum of left hand fingers 4 and 5, areas, about 2 x 1 cm on each.  Psychiatric: She has a normal mood and affect. Her behavior is normal. Judgment and thought content normal.  Nursing note and vitals reviewed.   ED Course  Procedures (including critical care time)  Medications  Tdap (BOOSTRIX) injection 0.5 mL (0.5 mLs Intramuscular Given 08/14/15 1731)  silver sulfADIAZINE (SILVADENE) 1 % cream ( Topical Given 08/14/15 1732)    Patient Vitals for the past 24 hrs:  BP Temp Temp src Pulse Resp SpO2 Height Weight  08/14/15 1655 184/91 mmHg 98.6 F (37 C) Oral 74 18 100 % 5\' 5"  (1.651 m) 274 lb (124.286 kg)    Wound care per nursing with Silvadene dressing. \  Labs Review Labs Reviewed - No data to display  Imaging Review No results found. I have personally reviewed and evaluated these images and lab results as part of my medical decision-making.   EKG Interpretation None      MDM   Final diagnoses:  Burn, hand, first degree, left, initial encounter    Partial-thickness burn fingers, left hand. Non-circumferential burns.  Nursing Notes Reviewed/ Care Coordinated Applicable Imaging Reviewed Interpretation of Laboratory Data incorporated into ED treatment  The patient appears reasonably screened and/or stabilized for discharge and I doubt any other medical condition or other [facility] requiring further screening, evaluation, or treatment in the ED at this time prior to discharge.  Plan: Home Medications- Norco, Silvadene; Home Treatments- daily cleansing and dressing changes at  home; return here if the recommended treatment, does not improve the symptoms; Recommended follow up- return here or see PCP for problems.     Daleen Bo, MD 08/14/15 2007

## 2015-08-14 NOTE — ED Notes (Signed)
2nd degree burn to left hand, hand wrapped with silvadine and clean dressing.

## 2015-10-29 ENCOUNTER — Encounter (HOSPITAL_COMMUNITY): Payer: Self-pay | Admitting: Emergency Medicine

## 2015-10-29 ENCOUNTER — Observation Stay (HOSPITAL_COMMUNITY)
Admission: EM | Admit: 2015-10-29 | Discharge: 2015-10-30 | Disposition: A | Discharge status: Home / Self Care | Payer: Self-pay | Attending: Obstetrics and Gynecology | Admitting: Obstetrics and Gynecology

## 2015-10-29 ENCOUNTER — Emergency Department (HOSPITAL_COMMUNITY): Payer: Self-pay

## 2015-10-29 DIAGNOSIS — I10 Essential (primary) hypertension: Secondary | ICD-10-CM | POA: Insufficient documentation

## 2015-10-29 DIAGNOSIS — F17210 Nicotine dependence, cigarettes, uncomplicated: Secondary | ICD-10-CM | POA: Insufficient documentation

## 2015-10-29 DIAGNOSIS — N7011 Chronic salpingitis: Principal | ICD-10-CM | POA: Insufficient documentation

## 2015-10-29 DIAGNOSIS — N898 Other specified noninflammatory disorders of vagina: Secondary | ICD-10-CM | POA: Insufficient documentation

## 2015-10-29 DIAGNOSIS — D72829 Elevated white blood cell count, unspecified: Secondary | ICD-10-CM | POA: Insufficient documentation

## 2015-10-29 DIAGNOSIS — R1030 Lower abdominal pain, unspecified: Secondary | ICD-10-CM

## 2015-10-29 DIAGNOSIS — R109 Unspecified abdominal pain: Secondary | ICD-10-CM | POA: Diagnosis present

## 2015-10-29 HISTORY — DX: Gonococcal infection, unspecified: A54.9

## 2015-10-29 HISTORY — DX: Benign neoplasm of connective and other soft tissue, unspecified: D21.9

## 2015-10-29 HISTORY — DX: Chlamydial infection, unspecified: A74.9

## 2015-10-29 HISTORY — DX: Chronic salpingitis: N70.11

## 2015-10-29 LAB — BASIC METABOLIC PANEL
Anion gap: 6 (ref 5–15)
BUN: 9 mg/dL (ref 6–20)
CALCIUM: 8.6 mg/dL — AB (ref 8.9–10.3)
CO2: 26 mmol/L (ref 22–32)
Chloride: 103 mmol/L (ref 101–111)
Creatinine, Ser: 0.65 mg/dL (ref 0.44–1.00)
GFR calc Af Amer: >60 mL/min (ref >60)
GLUCOSE: 91 mg/dL (ref 65–99)
Potassium: 3.3 mmol/L — ABNORMAL LOW (ref 3.5–5.1)
Sodium: 135 mmol/L (ref 135–145)

## 2015-10-29 LAB — URINE MICROSCOPIC-ADD ON

## 2015-10-29 LAB — URINALYSIS, ROUTINE W REFLEX MICROSCOPIC
Bilirubin Urine: NEGATIVE
GLUCOSE, UA: NEGATIVE mg/dL
Ketones, ur: 15 mg/dL — AB
Leukocytes, UA: NEGATIVE
Nitrite: NEGATIVE
PH: 6.5 (ref 5.0–8.0)
Protein, ur: NEGATIVE mg/dL
SPECIFIC GRAVITY, URINE: 1.02 (ref 1.005–1.030)

## 2015-10-29 LAB — CBC WITH DIFFERENTIAL/PLATELET
Basophils Absolute: 0 10*3/uL (ref 0.0–0.1)
Basophils Relative: 0 %
EOS PCT: 0 %
Eosinophils Absolute: 0 10*3/uL (ref 0.0–0.7)
HCT: 35.9 % — ABNORMAL LOW (ref 36.0–46.0)
Hemoglobin: 12 g/dL (ref 12.0–15.0)
LYMPHS ABS: 1.2 10*3/uL (ref 0.7–4.0)
LYMPHS PCT: 9 %
MCH: 30.8 pg (ref 26.0–34.0)
MCHC: 33.4 g/dL (ref 30.0–36.0)
MCV: 92.1 fL (ref 78.0–100.0)
MONO ABS: 0.9 10*3/uL (ref 0.1–1.0)
MONOS PCT: 6 %
Neutro Abs: 11.8 10*3/uL — ABNORMAL HIGH (ref 1.7–7.7)
Neutrophils Relative %: 85 %
PLATELETS: 206 10*3/uL (ref 150–400)
RBC: 3.9 MIL/uL (ref 3.87–5.11)
RDW: 13.2 % (ref 11.5–15.5)
WBC: 13.9 10*3/uL — ABNORMAL HIGH (ref 4.0–10.5)

## 2015-10-29 LAB — WET PREP, GENITAL
Sperm: NONE SEEN
Trich, Wet Prep: NONE SEEN
Yeast Wet Prep HPF POC: NONE SEEN

## 2015-10-29 LAB — PREGNANCY, URINE: PREG TEST UR: NEGATIVE

## 2015-10-29 MED ORDER — DEXTROSE 5 % IV SOLN
1.0000 g | Freq: Once | INTRAVENOUS | Status: AC
  Administered 2015-10-29: 1 g via INTRAVENOUS
  Filled 2015-10-29: qty 10

## 2015-10-29 MED ORDER — IOPAMIDOL (ISOVUE-300) INJECTION 61%
100.0000 mL | Freq: Once | INTRAVENOUS | Status: AC | PRN
  Administered 2015-10-29: 100 mL via INTRAVENOUS

## 2015-10-29 NOTE — ED Notes (Signed)
Patient ambulatory to restroom with steady gait, clean catch instructions given and advised pt to bring specimen back to room as well.  

## 2015-10-29 NOTE — Consult Note (Addendum)
SURGICAL CONSULTATION NOTE (initial)  HISTORY OF PRESENT ILLNESS (HPI):  50 y.o. female presented with pain during sexual intercourse and suprapubic abdominal pressure x 3 - 4 days, the latter of which she reports became worse early this morning and woke her from sleep around 4:45 am. 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 When she did not feel any better (though she says no worse), she presented to the health department clinic, only to find the care providers had already left for the day. Accordingly, she presented to [facility] for workup and management. Since presenting, she says she feels slightly better, denies nausea/vomiting or fever/chills. She also reports she's previously experienced upper and generalized abdominal pain that she's attributed to gastritis, most recently this past October, for which she does not take any medication. She has a new sexual partner for the past 4 months, with whom she reports unprotected sex. She reports she has never been pregnant, though she's also never been told she couldn't have children in the context of her Left hydrosalpinx and childhood oophorectomy and salpingectomy.  PAST MEDICAL HISTORY (PMH):  Past Medical History:  Diagnosis Date  . Acid reflux   . Chlamydia   . Fibroids   . Gonorrhea   . Hydrosalpinx   . Hypertension   . Neuropathy ([place])      PAST SURGICAL HISTORY ([place]):  Past Surgical History:  Procedure Laterality Date  . COSMETIC SURGERY    . [place] as a child  . UNILATERAL SALPINGECTOMY     [facility] as a child     MEDICATIONS:  Prior to Admission medications   Medication Sig Start Date End Date Taking? Authorizing Provider  EPINEPHrine (EPI-PEN) 0.3 mg/0.3 mL SOAJ Stab the outer thigh and come to ED immediately if acute allergic reactions occur. 10/07/12   Lily Kocher, PA-C  HYDROcodone-acetaminophen (NORCO) 5-325 MG  tablet Take 1 tablet by mouth every 4 (four) hours as needed. Patient not taking: Reported on 10/29/2015 08/14/15   Daleen Bo, MD     ALLERGIES:  Allergies  Allergen Reactions  . Bee Venom Anaphylaxis  . Dust Mite Extract     Eye swelling, hard to breath  . Latex Itching  . Peanut-Containing Drug Products     Throat and ears itch     SOCIAL HISTORY:  Social History   Social History  . Marital status: Single    Spouse name: N/A  . Number of children: N/A  . Years of education: N/A   Occupational History  . Not on file.   Social History Main Topics  . Smoking status: Current Some Day Smoker    Packs/day: 0.01    Types: Cigarettes  . Smokeless tobacco: Never Used  . Alcohol use Yes     Comment: occas  . Drug use: No  . Sexual activity: Yes    Birth control/ protection: None   Other Topics Concern  . Not on file   Social History Narrative  . No narrative on file    The patient currently resides (home / rehab facility / nursing home): Home  The patient normally is (ambulatory / bedbound): Ambulatory   FAMILY HISTORY:  Family History  Problem Relation Age of Onset  . Hypertension Mother   . Diabetes Mother   . Hypertension Father      REVIEW OF SYSTEMS:  Constitutional: denies weight loss,  fever, chills, or sweats  Eyes: denies any other vision changes, history of eye injury  ENT: denies sore throat, hearing problems  Respiratory: denies shortness of breath, wheezing  Cardiovascular: denies chest pain, palpitations  Gastrointestinal: suprapubic abdominal pressure as per HPI  Musculoskeletal: denies any other joint pains or cramps  Skin: denies any other rashes or skin discolorations  Neurological: denies any other headache, dizziness, weakness  Psychiatric: denies any other depression, anxiety   All other review of systems were negative   VITAL SIGNS:  Temp:  [100.1 F (37.8 C)] 100.1 F (37.8 C) (08/18 1629) Pulse Rate:  [71-86] 73 (08/18  2237) Resp:  [18-22] 20 (08/18 2237) BP: (119-142)/(70-74) 119/70 (08/18 2237) SpO2:  [93 %-100 %] 97 % (08/18 2237) Weight:  [118.8 kg (262 lb)] 118.8 kg (262 lb) (08/18 1629)     Height: 5\' 5"  (165.1 cm) Weight: 118.8 kg (262 lb) BMI (Calculated): 43.7   INTAKE/OUTPUT:  This shift: No intake/output data recorded.  Last 2 shifts: @IOLAST2SHIFTS @   PHYSICAL EXAM:  Constitutional:  -- Normal overweight body habitus  -- Awake, alert, and oriented x3  Eyes:  -- Pupils equally round and reactive to light  -- No scleral icterus  Ear, nose, and throat:  -- No jugular venous distension  Pulmonary:  -- No crackles  -- Equal breath sounds bilaterally  Cardiovascular:  -- S1, S2 present  -- No pericardial rubs Gastrointestinal:  -- Abdomen soft, mild-moderate suprapubic discomfort with palpation, RLQ non-tender, nondistended, no guarding/rebound  -- No abdominal masses appreciated, pulsatile or otherwise  Musculoskeletal / Integumentary:  -- Wounds or skin discoloration: None appreciated -- Extremities: B/L UE and LE FROM, hands and feet warm Neurologic:  -- Motor function: intact and symmetric -- Sensation: intact and symmetric  Labs:  CBC:  Lab Results  Component Value Date   WBC 13.9 (H) 10/29/2015   RBC 3.90 10/29/2015   BMP:  Lab Results  Component Value Date   GLUCOSE 91 10/29/2015   CO2 26 10/29/2015   BUN 9 10/29/2015   CREATININE 0.65 10/29/2015   CALCIUM 8.6 (L) 10/29/2015     Imaging studies:  CT Abdomen and Pelvis with Contrast (10/29/2015) Stomach/Bowel: Normal stomach, without wall thickening. Normal colon and terminal ileum. The appendix is upper normal to minimally thickened, including at 8.9 mm on coronal image 96 and on transverse image 56. Minimal periappendiceal edema cannot be excluded on image 57/series 2. Normal small bowel.  Reproductive: Left-sided hydrosalpinx is again identified. The dilated tube is slightly enlarged compared to 2010 exam.  Example on the order of 2.7 x 8.0 cm on image 72/series 2. No evidence of tubo-ovarian abscess. Normal uterus.  Assessment/Plan:  50 y.o. female afebrile with leukocytosis, slightly worsened Left hydrosalpinx + upper normal appendiceal diameter and possible minimal peri-appendiceal edema, adding seemingly less likely early mild acute appendicitis to her differential diagnoses, complicated by pertinent comorbidities including HTN, GERD, and uterine fibroids.   - NPO, IV fluids, and PPI  - IV antibiotics (Rocephin unless otherwise per gyn)  - avoid vs minimize pain medications to avoid confusing her exam  - options including antibiotics alone vs surgical removal of potentially normal appendix discussed with patient  - patient wishes to first attempt management with antibiotics alone  - will follow up morning abdominal exam   - recommend gynecology consultation  - check morning WBC count  - please call if any questions  - ambulation encouraged  All of the above findings and recommendations were  discussed with the patient, and all of her questions were answered to her expressed satisfaction.  Thank you for the opportunity to participate in this patient's care.   -- Marilynne Drivers Rosana Hoes, MD, [place]: [place] and Vascular Surgery Office: [phone]

## 2015-10-29 NOTE — ED Provider Notes (Signed)
[place] DEPT Provider Note   CSN: [id number] Arrival date & time: 10/29/15  1623     History   Chief Complaint Chief Complaint  Patient presents with  . Abdominal Pain    HPI Diana Blevins is a 50 y.o. G0, LMP 10/10/15, who presents to the ED with abdominal pain. She reports the pain started yesterday in the lower abdomen. She describes the pain as aching. She has vaginal d/c with an odor. She is sexually active with one partner x 4 months, unprotected sex. Hx of GC, Chlamydia and BV. Hx of uterine fibroids. She reports that today the pain has gotten worse and is more on the right side.   The history is provided by the patient. No language interpreter was used.  Abdominal Pain   This is a new problem. The current episode started yesterday. The problem occurs constantly. The problem has been gradually worsening. The pain is located in the RLQ and suprapubic region. The pain is at a severity of 8/10. Associated symptoms include flatus and nausea. Pertinent negatives include fever, diarrhea, vomiting, constipation, dysuria and frequency. Nothing aggravates the symptoms. Nothing relieves the symptoms. Her past medical history is significant for GERD. Her past medical history does not include PUD, gallstones, ulcerative colitis, Crohn's disease or irritable bowel syndrome.    Past Medical History:  Diagnosis Date  . Acid reflux   . Chlamydia   . Fibroids   . Gonorrhea   . Hydrosalpinx   . Hypertension   . Neuropathy [facility])     Patient Active Problem List   Diagnosis Date Noted  . Abdominal pain 10/30/2015    Past Surgical History:  Procedure Laterality Date  . COSMETIC SURGERY    . [place] as a child  . UNILATERAL SALPINGECTOMY     [facility] as a child    OB History    Gravida Para Term Preterm AB Living             0   SAB TAB Ectopic Multiple Live Births                   Home Medications    Prior to Admission  medications   Medication Sig Start Date End Date Taking? Authorizing Provider  EPINEPHrine (EPI-PEN) 0.3 mg/0.3 mL SOAJ Stab the outer thigh and come to ED immediately if acute allergic reactions occur. 10/07/12   Lily Kocher, PA-C    Family History Family History  Problem Relation Age of Onset  . Hypertension Mother   . Diabetes Mother   . Hypertension Father     Social History Social History  Substance Use Topics  . Smoking status: Current Some Day Smoker    Packs/day: 0.01    Types: Cigarettes  . Smokeless tobacco: Never Used  . Alcohol use Yes     Comment: occas     Allergies   Bee venom; Dust mite extract; Latex; and Peanut-containing drug products   Review of Systems Review of Systems  Constitutional: Negative for fever.  HENT: Negative.   Eyes: Negative for visual disturbance.  Respiratory: Negative for choking, chest tightness and shortness of breath.   Cardiovascular: Negative for chest pain, palpitations and leg swelling.  Gastrointestinal: Positive for abdominal pain, flatus and nausea. Negative for anal bleeding, blood in stool, constipation, diarrhea, rectal pain and vomiting.  Genitourinary: Positive for decreased urine volume, urgency and vaginal discharge. Negative for dysuria, frequency and vaginal bleeding.  Musculoskeletal: Positive for back pain. Negative for neck stiffness.  Skin: Negative for rash.  Neurological: Negative for dizziness and syncope.  Psychiatric/Behavioral: Negative for confusion. The patient is not nervous/anxious.      Physical Exam Updated Vital Signs BP 119/70 (BP Location: Left Arm)   Pulse 73   Temp 100.1 F (37.8 C) (Oral)   Resp 20   Ht 5\' 5"  (1.651 m)   Wt 118.8 kg   LMP 10/11/2015   SpO2 97%   BMI 43.60 kg/m   Physical Exam  Constitutional: She is oriented to person, place, and time. She appears well-developed and well-nourished. No distress.  HENT:  Head: Normocephalic and atraumatic.  Mouth/Throat: Uvula  is midline, oropharynx is clear and moist and mucous membranes are normal.  Eyes: Conjunctivae and EOM are normal. Pupils are equal, round, and reactive to light.  Neck: Normal range of motion. Neck supple.  Cardiovascular: Normal rate and regular rhythm.   Pulmonary/Chest: Effort normal. No respiratory distress. She has no wheezes. She has no rales.  Abdominal: Soft. She exhibits no distension. There is tenderness. There is no rebound.  Tender with palpation right lower abdomen.   Genitourinary:  Genitourinary Comments: External genitalia without lesions. Creamy d/c vaginal vault. Positive CMT, right adnexal tenderness. Uterus slightly enlarged and irregular.   Musculoskeletal: Normal range of motion.  Neurological: She is alert and oriented to person, place, and time. No cranial nerve deficit.  Skin: Skin is warm and dry.  Psychiatric: She has a normal mood and affect. Her behavior is normal.  Nursing note and vitals reviewed.    ED Treatments / Results  Labs (all labs ordered are listed, but only abnormal results are displayed) Labs Reviewed  WET PREP, GENITAL - Abnormal; Notable for the following:       Result Value   Clue Cells Wet Prep HPF POC PRESENT (*)    WBC, Wet Prep HPF POC MANY (*)    All other components within normal limits  URINALYSIS, ROUTINE W REFLEX MICROSCOPIC (NOT AT [facility]) - Abnormal; Notable for the following:    Hgb urine dipstick TRACE (*)    Ketones, ur 15 (*)    All other components within normal limits  CBC WITH DIFFERENTIAL/PLATELET - Abnormal; Notable for the following:    WBC 13.9 (*)    HCT 35.9 (*)    Neutro Abs 11.8 (*)    All other components within normal limits  BASIC METABOLIC PANEL - Abnormal; Notable for the following:    Potassium 3.3 (*)    Calcium 8.6 (*)    All other components within normal limits  URINE MICROSCOPIC-ADD ON - Abnormal; Notable for the following:    Squamous Epithelial / LPF 0-5 (*)    Bacteria, UA FEW (*)    All  other components within normal limits  PREGNANCY, URINE  RPR  HIV ANTIBODY (ROUTINE TESTING)  GC/CHLAMYDIA PROBE AMP ([place]) NOT AT [facility]   Radiology Ct Abdomen Pelvis W Contrast  Result Date: 10/29/2015 CLINICAL DATA:  Lower abdominal pain with vaginal discharge for the past few days. Left lower quadrant and right lower quadrant pain. Gas. Constipation for 2 days. EXAM: CT ABDOMEN AND PELVIS WITH CONTRAST TECHNIQUE: Multidetector CT imaging of the abdomen and pelvis was performed using the standard protocol following bolus administration of intravenous contrast. CONTRAST:  179mL ISOVUE-300 IOPAMIDOL (ISOVUE-300) INJECTION 61% COMPARISON:  Pelvic ultrasound 08/31/2008.  CT of 08/31/2008. FINDINGS: Lower chest: Clear lung bases. Normal heart size without pericardial or  pleural effusion. Hepatobiliary: Normal liver. Normal gallbladder, without biliary ductal dilatation. Pancreas: Normal, without mass or ductal dilatation. Spleen: Normal in size, without focal abnormality. Adrenals/Urinary Tract: Mild left adrenal thickening. A right adrenal low-density nodule was present previously but has enlarged. 2.3 cm and 10 Hounsfield units on delayed images. Normal kidneys, without hydronephrosis.  Normal urinary bladder. Stomach/Bowel: Normal stomach, without wall thickening. Normal colon and terminal ileum. The appendix is upper normal to minimally thickened, including at 9 mm on coronal image 96 and on transverse image 56. Minimal periappendiceal edema cannot be excluded on image 57/series 2. Normal small bowel. Vascular/Lymphatic: Normal caliber of the aorta and branch vessels. No abdominopelvic adenopathy. Reproductive: Left-sided hydrosalpinx is again identified. The dilated tube is slightly enlarged compared to 2010 exam. Example on the order of 2.7 x 8.0 cm on image 72/series 2. No evidence of tubo-ovarian abscess. Normal uterus. Other: No significant free fluid. Musculoskeletal: No acute osseous  abnormality. IMPRESSION: 1. Since 2010, increase in hydrosalpinx. This could account for the patient's symptoms. 2. Upper normal to mildly enlarged appendix. Cannot exclude mild periappendiceal inflammation. If the patient's symptoms are acute or have worsened over the past few hours, early appendicitis cannot be excluded. If the patient's symptoms are chronic, appearance is favored to be within normal variation. 3. Adrenal adenoma. Electronically Signed   By: Abigail Miyamoto M.D.   On: 10/29/2015 21:50    Procedures Procedures (including critical care time)  Medications Ordered in ED Medications  iopamidol (ISOVUE-300) 61 % injection 100 mL (100 mLs Intravenous Contrast Given 10/29/15 2116)  cefTRIAXone (ROCEPHIN) 1 g in dextrose 5 % 50 mL IVPB (0 g Intravenous Stopped 10/29/15 2258)     Initial Impression / Assessment and Plan / ED Course  I have reviewed the triage vital signs and the nursing notes.  Pertinent labs & imaging results that were available during my care of the patient were reviewed by me and considered in my medical decision making (see chart for details).  Clinical Course   Consult with general surgery and he will be in to examine the patient.  Dr. Rosana Hoes, general surgery, in to see the patient. Consult Dr. Glo Herring, GYN, he will admit the patient to his service and Dr. Rosana Hoes will round on the patient also in the am.   Patient with abdominal pain possible appendicitis vs PID. Will start antibiotics and observe.   Final Clinical Impressions(s) / ED Diagnoses  50 y.o. female with 2 day history of lower abdominal pain. Will admit for IV antibiotics and observation.  Final diagnoses:  Lower abdominal pain    New Prescriptions Current Discharge Medication List       [facility], NP 10/30/15 [id number]    Pattricia Boss, MD 11/04/15 1235

## 2015-10-29 NOTE — ED Triage Notes (Signed)
Pt reports lower abd pain with vaginal discharge with foul odor for the past few days.

## 2015-10-30 ENCOUNTER — Encounter (HOSPITAL_COMMUNITY): Payer: Self-pay | Admitting: Surgery

## 2015-10-30 DIAGNOSIS — R102 Pelvic and perineal pain: Secondary | ICD-10-CM

## 2015-10-30 DIAGNOSIS — R109 Unspecified abdominal pain: Secondary | ICD-10-CM | POA: Diagnosis present

## 2015-10-30 DIAGNOSIS — R1031 Right lower quadrant pain: Secondary | ICD-10-CM

## 2015-10-30 DIAGNOSIS — N926 Irregular menstruation, unspecified: Secondary | ICD-10-CM

## 2015-10-30 DIAGNOSIS — N7011 Chronic salpingitis: Secondary | ICD-10-CM | POA: Diagnosis present

## 2015-10-30 DIAGNOSIS — N898 Other specified noninflammatory disorders of vagina: Secondary | ICD-10-CM

## 2015-10-30 DIAGNOSIS — R110 Nausea: Secondary | ICD-10-CM

## 2015-10-30 DIAGNOSIS — R143 Flatulence: Secondary | ICD-10-CM

## 2015-10-30 DIAGNOSIS — R1030 Lower abdominal pain, unspecified: Secondary | ICD-10-CM

## 2015-10-30 DIAGNOSIS — K5900 Constipation, unspecified: Secondary | ICD-10-CM

## 2015-10-30 LAB — CBC WITH DIFFERENTIAL/PLATELET
BASOS ABS: 0 10*3/uL (ref 0.0–0.1)
BASOS PCT: 0 %
Eosinophils Absolute: 0.1 10*3/uL (ref 0.0–0.7)
Eosinophils Relative: 0 %
HEMATOCRIT: 38.4 % (ref 36.0–46.0)
HEMOGLOBIN: 12.7 g/dL (ref 12.0–15.0)
Lymphocytes Relative: 11 %
Lymphs Abs: 1.6 10*3/uL (ref 0.7–4.0)
MCH: 30.3 pg (ref 26.0–34.0)
MCHC: 33.1 g/dL (ref 30.0–36.0)
MCV: 91.6 fL (ref 78.0–100.0)
Monocytes Absolute: 0.8 10*3/uL (ref 0.1–1.0)
Monocytes Relative: 6 %
NEUTROS ABS: 11.3 10*3/uL — AB (ref 1.7–7.7)
NEUTROS PCT: 83 %
Platelets: 212 10*3/uL (ref 150–400)
RBC: 4.19 MIL/uL (ref 3.87–5.11)
RDW: 13.2 % (ref 11.5–15.5)
WBC: 13.7 10*3/uL — AB (ref 4.0–10.5)

## 2015-10-30 LAB — SEDIMENTATION RATE: SED RATE: 52 mm/h — AB (ref 0–22)

## 2015-10-30 LAB — RPR: RPR: NONREACTIVE

## 2015-10-30 LAB — HIV ANTIBODY (ROUTINE TESTING W REFLEX): HIV Screen 4th Generation wRfx: NONREACTIVE

## 2015-10-30 MED ORDER — ACETAMINOPHEN-CODEINE #3 300-30 MG PO TABS
2.0000 | ORAL_TABLET | ORAL | 0 refills | Status: DC | PRN
Start: 2015-10-30 — End: 2016-08-08

## 2015-10-30 MED ORDER — ONDANSETRON HCL 4 MG/2ML IJ SOLN: 4.0000 mg | Freq: Three times a day (TID) | INTRAMUSCULAR | Status: DC | PRN

## 2015-10-30 MED ORDER — DOXYCYCLINE HYCLATE 100 MG PO TABS: 100.0000 mg | ORAL_TABLET | Freq: Two times a day (BID) | ORAL | 0 refills | Status: DC

## 2015-10-30 MED ORDER — DOXYCYCLINE HYCLATE 100 MG PO TABS
100.0000 mg | ORAL_TABLET | Freq: Two times a day (BID) | ORAL | Status: DC
  Administered 2015-10-30 (×2): 100 mg via ORAL
  Filled 2015-10-30 (×2): qty 1

## 2015-10-30 MED ORDER — ATENOLOL 25 MG PO TABS
25.0000 mg | ORAL_TABLET | Freq: Every day | ORAL | Status: DC
  Administered 2015-10-30: 25 mg via ORAL
  Filled 2015-10-30: qty 1

## 2015-10-30 MED ORDER — METRONIDAZOLE 500 MG PO TABS: 500.0000 mg | ORAL_TABLET | Freq: Two times a day (BID) | ORAL | 0 refills | Status: DC

## 2015-10-30 MED ORDER — DEXTROSE 5 % IV SOLN
2.0000 g | Freq: Two times a day (BID) | INTRAVENOUS | Status: DC
  Administered 2015-10-30: 2 g via INTRAVENOUS
  Filled 2015-10-30 (×5): qty 2

## 2015-10-30 NOTE — Progress Notes (Signed)
[facility] Hospital Day(s): 0.   Post op day(s):  Marland Kitchen   Interval History: Patient seen and examined, no acute events or new complaints since seen overnight. Patient reports both her suprapubic "tenderness" and "pressure" have "eased up" with +flatus and +ambulating to bathroom, and she denies fever, nausea, CP, or SOB.  Review of Systems:  Constitutional: denies fever, chills  HEENT: denies cough or congestion  Respiratory: denies any shortness of breath  Cardiovascular: denies chest pain or palpitations  Gastrointestinal: abdominal pain as per HPI, denies N/V or diarrhea Musculoskeletal: denies pain, decreased motor or sensation  Neurological: denies HA or vision/hearing changes   Vital signs in last 24 hours: [min-max] current  Temp:  [98.7 F (37.1 C)-100.1 F (37.8 C)] 98.7 F (37.1 C) (08/19 0056) Pulse Rate:  [71-86] 72 (08/19 0056) Resp:  [18-22] 20 (08/19 0056) BP: (119-152)/(57-124) 129/57 (08/19 0102) SpO2:  [93 %-100 %] 99 % (08/19 0056) Weight:  [112.3 kg (247 lb 8 oz)-118.8 kg (262 lb)] 112.3 kg (247 lb 8 oz) (08/19 0056)     Height: 5\' 5"  (165.1 cm) Weight: 112.3 kg (247 lb 8 oz) BMI (Calculated): 41.3   Intake/Output this shift:  No intake/output data recorded.   Intake/Output last 2 shifts:  @IOLAST2SHIFTS @   Physical Exam:  Constitutional: alert, cooperative and no distress  HENT: normocephalic without obvious abnormality  Eyes: PERRL, EOM's grossly intact and symmetric  Neuro: CN II - XII grossly intact and symmetric without deficit  Respiratory: breathing non-labored at rest  Cardiovascular: regular rate and sinus rhythm  Gastrointestinal: soft, overweight, minimal epigastric tenderness to palpation, lower abdomen now non-tender to palpation, and non-distended  Musculoskeletal: UE and LE FROM, motor and sensation grossly intact  Labs: No new labs available  Imaging studies: No new pertinent imaging studies    Assessment/Plan:  49  y.o. female afebrile with leukocytosis yesterday, slightly worsened Left hydrosalpinx on CT abdomen and pelvis with oral and IV contrast yesterday + upper normal appendiceal diameter and possible minimal peri-appendiceal edema, adding early mild acute appendicitis to her differential diagnoses, complicated by pertinent comorbidities including HTN, GERD, and uterine fibroids.                        - IV antibiotics as per gynecology, PPI or H2 antagonist                       - no indication for urgent surgical intervention at this time            - okay from general surgery perspective to resume regular diet                       - patient expresses wishes to continue with non-operative management                       - avoid vs minimize pain medications to avoid confusing her exam                       - can follow-up prn, will place office contact info in d/c instructions                       - follow-up repeat WBC count for leukocytosis            - please call if any questions                       -  ambulation encouraged  All of the above findings and recommendations were discussed with the patient, and all of her questions were answered to her expressed satisfaction.  Thank you for the opportunity to participate in this patient's care.   -- Marilynne Drivers Rosana Hoes, MD, [place]: [place] and Vascular Surgery Office: [phone]

## 2015-10-30 NOTE — H&P (Signed)
Diana Blevins is an 50 y.o. female. She is s/p unilateral salpingoophorectomy  LMP late July, irregular the last year, sexually active, who had 3 d of abd discomfort.Seen last night for pelvic pain, with surgical consult and gyn . CT showed unilateral hydrosalpinx. gen surgery did not feel appendix was a gprobll  Pertinent Gynecological History: Menses: irregular occurring approximately every 30-60 days with spotting approximately 5-7 days per month Bleeding:  Contraception: none DES exposure:  Blood transfusions: none Sexually transmitted diseases: hydrosalpinx Previous GYN Procedures: unilateral s& O  Last mammogram:  Date:  Last pap:  Date:  OB History: G, P   Menstrual History: Menarche age:  Patient's last menstrual period was 10/11/2015.    Past Medical History:  Diagnosis Date  . Acid reflux   . Chlamydia   . Fibroids   . Gonorrhea   . Hydrosalpinx   . Hypertension   . Neuropathy [facility])     Past Surgical History:  Procedure Laterality Date  . COSMETIC SURGERY    . [place] as a child  . UNILATERAL SALPINGECTOMY     [facility] as a child    Family History  Problem Relation Age of Onset  . Hypertension Mother   . Diabetes Mother   . Hypertension Father     Social History:  reports that she has been smoking Cigarettes.  She has been smoking about 0.01 packs per day. She has never used smokeless tobacco. She reports that she drinks alcohol. She reports that she does not use drugs.  Allergies:  Allergies  Allergen Reactions  . Bee Venom Anaphylaxis  . Dust Mite Extract     Eye swelling, hard to breath  . Latex Itching  . Peanut-Containing Drug Products     Throat and ears itch    Prescriptions Prior to Admission  Medication Sig Dispense Refill Last Dose  . EPINEPHrine (EPI-PEN) 0.3 mg/0.3 mL SOAJ Stab the outer thigh and come to ED immediately if acute allergic reactions occur. 1 Device 0 unknown    ROS  Blood  pressure (!) 129/57, pulse 72, temperature 98.7 F (37.1 C), temperature source Oral, resp. rate 20, height 5\' 5"  (1.651 m), weight 112.3 kg (247 lb 8 oz), last menstrual period 10/11/2015, SpO2 99 %. Physical Exam  Results for orders placed or performed during the hospital encounter of 10/29/15 (from the past 24 hour(s))  Urinalysis, Routine w reflex microscopic- may I&O cath if menses     Status: Abnormal   Collection Time: 10/29/15  5:00 PM  Result Value Ref Range   Color, Urine YELLOW YELLOW   APPearance CLEAR CLEAR   Specific Gravity, Urine 1.020 1.005 - 1.030   pH 6.5 5.0 - 8.0   Glucose, UA NEGATIVE NEGATIVE mg/dL   Hgb urine dipstick TRACE (A) NEGATIVE   Bilirubin Urine NEGATIVE NEGATIVE   Ketones, ur 15 (A) NEGATIVE mg/dL   Protein, ur NEGATIVE NEGATIVE mg/dL   Nitrite NEGATIVE NEGATIVE   Leukocytes, UA NEGATIVE NEGATIVE  Urine microscopic-add on     Status: Abnormal   Collection Time: 10/29/15  5:00 PM  Result Value Ref Range   Squamous Epithelial / LPF 0-5 (A) NONE SEEN   WBC, UA 0-5 0 - 5 WBC/hpf   RBC / HPF 0-5 0 - 5 RBC/hpf   Bacteria, UA FEW (A) NONE SEEN  Pregnancy, urine     Status: None   Collection Time: 10/29/15  5:00 PM  Result Value  Ref Range   Preg Test, Ur NEGATIVE NEGATIVE  CBC with Differential     Status: Abnormal   Collection Time: 10/29/15  5:01 PM  Result Value Ref Range   WBC 13.9 (H) 4.0 - 10.5 K/uL   RBC 3.90 3.87 - 5.11 MIL/uL   Hemoglobin 12.0 12.0 - 15.0 g/dL   HCT 35.9 (L) 36.0 - 46.0 %   MCV 92.1 78.0 - 100.0 fL   MCH 30.8 26.0 - 34.0 pg   MCHC 33.4 30.0 - 36.0 g/dL   RDW 13.2 11.5 - 15.5 %   Platelets 206 150 - 400 K/uL   Neutrophils Relative % 85 %   Neutro Abs 11.8 (H) 1.7 - 7.7 K/uL   Lymphocytes Relative 9 %   Lymphs Abs 1.2 0.7 - 4.0 K/uL   Monocytes Relative 6 %   Monocytes Absolute 0.9 0.1 - 1.0 K/uL   Eosinophils Relative 0 %   Eosinophils Absolute 0.0 0.0 - 0.7 K/uL   Basophils Relative 0 %   Basophils Absolute 0.0 0.0  - 0.1 K/uL  Basic metabolic panel     Status: Abnormal   Collection Time: 10/29/15  5:01 PM  Result Value Ref Range   Sodium 135 135 - 145 mmol/L   Potassium 3.3 (L) 3.5 - 5.1 mmol/L   Chloride 103 101 - 111 mmol/L   CO2 26 22 - 32 mmol/L   Glucose, Bld 91 65 - 99 mg/dL   BUN 9 6 - 20 mg/dL   Creatinine, Ser 0.65 0.44 - 1.00 mg/dL   Calcium 8.6 (L) 8.9 - 10.3 mg/dL   GFR calc non Af Amer >60 >60 mL/min   GFR calc Af Amer >60 >60 mL/min   Anion gap 6 5 - 15  RPR     Status: None   Collection Time: 10/29/15  5:01 PM  Result Value Ref Range   RPR Ser Ql Non Reactive Non Reactive  HIV antibody (routine testing) (NOT for [facility])     Status: None   Collection Time: 10/29/15  5:01 PM  Result Value Ref Range   HIV Screen 4th Generation wRfx Non Reactive Non Reactive  Wet prep, genital     Status: Abnormal   Collection Time: 10/29/15  5:25 PM  Result Value Ref Range   Yeast Wet Prep HPF POC NONE SEEN NONE SEEN   Trich, Wet Prep NONE SEEN NONE SEEN   Clue Cells Wet Prep HPF POC PRESENT (A) NONE SEEN   WBC, Wet Prep HPF POC MANY (A) NONE SEEN   Sperm NONE SEEN     Ct Abdomen Pelvis W Contrast  Result Date: 10/29/2015 CLINICAL DATA:  Lower abdominal pain with vaginal discharge for the past few days. Left lower quadrant and right lower quadrant pain. Gas. Constipation for 2 days. EXAM: CT ABDOMEN AND PELVIS WITH CONTRAST TECHNIQUE: Multidetector CT imaging of the abdomen and pelvis was performed using the standard protocol following bolus administration of intravenous contrast. CONTRAST:  139mL ISOVUE-300 IOPAMIDOL (ISOVUE-300) INJECTION 61% COMPARISON:  Pelvic ultrasound 08/31/2008.  CT of 08/31/2008. FINDINGS: Lower chest: Clear lung bases. Normal heart size without pericardial or pleural effusion. Hepatobiliary: Normal liver. Normal gallbladder, without biliary ductal dilatation. Pancreas: Normal, without mass or ductal dilatation. Spleen: Normal in size, without focal abnormality.  Adrenals/Urinary Tract: Mild left adrenal thickening. A right adrenal low-density nodule was present previously but has enlarged. 2.3 cm and 10 Hounsfield units on delayed images. Normal kidneys, without hydronephrosis.  Normal urinary bladder. Stomach/Bowel: Normal  stomach, without wall thickening. Normal colon and terminal ileum. The appendix is upper normal to minimally thickened, including at 9 mm on coronal image 96 and on transverse image 56. Minimal periappendiceal edema cannot be excluded on image 57/series 2. Normal small bowel. Vascular/Lymphatic: Normal caliber of the aorta and branch vessels. No abdominopelvic adenopathy. Reproductive: Left-sided hydrosalpinx is again identified. The dilated tube is slightly enlarged compared to 2010 exam. Example on the order of 2.7 x 8.0 cm on image 72/series 2. No evidence of tubo-ovarian abscess. Normal uterus. Other: No significant free fluid. Musculoskeletal: No acute osseous abnormality. IMPRESSION: 1. Since 2010, increase in hydrosalpinx. This could account for the patient's symptoms. 2. Upper normal to mildly enlarged appendix. Cannot exclude mild periappendiceal inflammation. If the patient's symptoms are acute or have worsened over the past few hours, early appendicitis cannot be excluded. If the patient's symptoms are chronic, appearance is favored to be within normal variation. 3. Adrenal adenoma. Electronically Signed   By: Abigail Miyamoto M.D.   On: 10/29/2015 21:50    Assessment/Plan: Chronic left hydrosalpinx Hx Right S&O No evidence of appendicitis. Chr constipation Plan adv diet            Switch to po meds  Cbc now  Donnabelle Blanchard V 10/30/2015, 11:34 AM

## 2015-10-30 NOTE — Progress Notes (Signed)
Subjective: Patient reports that she feels 90% better, after having BM that was generous , watery tolerating PO, + flatus, + BM and no problems voiding.    Objective: I have reviewed patient's vital signs, medications and labs. ESR is in 50's consistent with chronic inflammatory process, of hydrosalpinx. WBC elevated, stable General: alert, cooperative and no distress GI: soft, non-tender; bowel sounds normal; no masses,  no organomegaly and normal findings: no guarding or rebound   Assessment/Plan: 1 resolved obstipation 2 no evidence of acute abdomen 3 Chronic hydrosalpinx 4. Ut fibroids stable. Plan:  D/c home on po doxycycline, po flagyl x 14 d F/u outpatient 2 wk for u/s,    LOS: 0 days    Laurel Smeltz V 10/30/2015, 5:40 PM

## 2015-10-30 NOTE — Discharge Summary (Signed)
Physician Discharge Summary  Patient ID: JERZY CROTTEAU MRN: [id number] DOB/AGE: [birth year] 50 y.o.  Admit date: 10/29/2015 Discharge date: 10/30/2015  Admission Diagnoses:Chronic abdominal pain and acute exacerbation Chronic left hydrosalpinx Discharge Diagnoses:  Hydrosalpinx left  abdominal pain improved  Discharged Condition: good  Hospital Course: HPI Diana Blevins is a 50 y.o. G0, LMP 10/10/15, who presents to the ED with abdominal pain. She reports the pain started yesterday in the lower abdomen. She describes the pain as aching. She has vaginal d/c with an odor. She is sexually active with one partner x 4 months, unprotected sex. Hx of GC, Chlamydia and BV. Hx of uterine fibroids. She reports that today the pain has gotten worse and is more on the right side.   The history is provided by the patient. No language interpreter was used.  Abdominal Pain   This is a new problem. The current episode started yesterday. The problem occurs constantly. The problem has been gradually worsening. The pain is located in the RLQ and suprapubic region. The pain is at a severity of 8/10. Associated symptoms include flatus and nausea. Pertinent negatives include fever, diarrhea, vomiting, constipation, dysuria and frequency. Nothing aggravates the symptoms. Nothing relieves the symptoms. Her past medical history is significant for GERD. Her past medical history does not include PUD, gallstones, ulcerative colitis, Crohn's disease or irritable bowel syndrome.   CT scan was performed revealing a slightly enlarged chronic left hydrosalpinx., And a minimally enlarged appendix.  White count was elevated at 13,000 evidence of acute abdomen.. Patient was admitted for IV antibiotic therapy for PID exacerbation versus appendicitis and monitored. Of note patient did not have bowel function over the last 3 days, after generous stool in response to the contrast media her pain essentially resolved. She still  has a chronic hydrosalpinx and sedimentation rate is elevated in the 50s treat with doxycycline and Flagyl 14 days have her follow-up in the office and give consideration to proceeding toward salpingectomy once acute processes are completely under control   Consults: general surgery and gynecology  Significant Diagnostic Studies: labs:  CBC Latest Ref Rng & Units 10/30/2015 10/29/2015 04/07/2015  WBC 4.0 - 10.5 K/uL 13.7(H) 13.9(H) 10.6(H)  Hemoglobin 12.0 - 15.0 g/dL 12.7 12.0 13.0  Hematocrit 36.0 - 46.0 % 38.4 35.9(L) 39.6  Platelets 150 - 400 K/uL 212 206 249  ESR 50's   Treatments: IV hydration and antibiotics: Cefotetan  Discharge Exam: Blood pressure (!) 118/58, pulse 77, temperature 98.8 F (37.1 C), temperature source Oral, resp. rate 20, height _0  (1.651 m), weight 112.3 kg (247 lb 8 oz), last menstrual period 10/11/2015, SpO2 100 %. General appearance: alert, cooperative and no distress Head: Normocephalic, without obvious abnormality, atraumatic GI: soft, non-tender; bowel sounds normal; no masses,  no organomegaly  Disposition: 01-Home or Self Care  Discharge Instructions    Call MD for:  persistant nausea and vomiting    Complete by:  As directed   Call MD for:  severe uncontrolled pain    Complete by:  As directed   Call MD for:  temperature >100.4    Complete by:  As directed   Diet - low sodium heart healthy    Complete by:  As directed   Discharge instructions    Complete by:  As directed   Continue antibiotics  X 2 wks.  Please followup at [facility] in 2 wk.   Increase activity slowly    Complete by:  As directed  Medication List    STOP taking these medications   HYDROcodone-acetaminophen 5-325 MG tablet Commonly known as:  NORCO     TAKE these medications   acetaminophen-codeine 300-30 MG tablet Commonly known as:  TYLENOL #3 Take 2 tablets by mouth every 4 (four) hours as needed for moderate pain.   doxycycline 100 MG tablet Commonly  known as:  VIBRA-TABS Take 1 tablet (100 mg total) by mouth every 12 (twelve) hours.   EPINEPHrine 0.3 mg/0.3 mL Soaj injection Commonly known as:  EPI-PEN Stab the outer thigh and come to ED immediately if acute allergic reactions occur.   metroNIDAZOLE 500 MG tablet Commonly known as:  FLAGYL Take 1 tablet (500 mg total) by mouth 2 (two) times daily. For patient x 2 wk      Follow-up Information    Vickie Epley, MD .   Specialty:  General Surgery Why:  Call office as needed if any questions of a surgical nature. Contact information: [place] [address] [place] V, MD Follow up in 2 week(s).   Specialties:  Obstetrics and Gynecology, Radiology Contact information: [address] [place] [address] [phone]           Signed: Jonnie Kind 10/30/2015, 6:25 PM

## 2015-11-01 ENCOUNTER — Telehealth: Payer: Self-pay | Admitting: Obstetrics and Gynecology

## 2015-11-01 LAB — GC/CHLAMYDIA PROBE AMP (~~LOC~~) NOT AT ARMC
CHLAMYDIA, DNA PROBE: NEGATIVE
NEISSERIA GONORRHEA: NEGATIVE

## 2015-11-01 NOTE — Telephone Encounter (Signed)
Pt states Doxycycline 100 mg ($80.00), and Flagyl. Total cost $114.00 out of pocket, pt states is there an alternative med as treatment that would be more affordable.

## 2015-11-02 NOTE — Telephone Encounter (Signed)
Spoke with nurse at [facility], pt is a pt there but has not been seen since 2016, appt scheduled for 11/04/2015 @ 8 am for pt to see NP and assist with Medication. Pt informed will need to go to the [facility] as soon as possible and sign release of information form to get records from were pt was seen at [facility] ER on 10/29/2015. Pt verbalized understanding.

## 2015-11-04 ENCOUNTER — Telehealth: Payer: Self-pay | Admitting: *Deleted

## 2015-11-04 NOTE — Telephone Encounter (Signed)
Pt states received a call from our office today. Pt informed will f/u with Dr. Glo Herring to see if he has called her.

## 2016-04-07 ENCOUNTER — Emergency Department (HOSPITAL_COMMUNITY)
Admission: EM | Admit: 2016-04-07 | Discharge: 2016-04-07 | Disposition: A | Discharge status: Home / Self Care | Payer: Medicaid Other | Attending: Emergency Medicine | Admitting: Emergency Medicine

## 2016-04-07 ENCOUNTER — Encounter (HOSPITAL_COMMUNITY): Payer: Self-pay

## 2016-04-07 DIAGNOSIS — I10 Essential (primary) hypertension: Secondary | ICD-10-CM | POA: Insufficient documentation

## 2016-04-07 DIAGNOSIS — N61 Mastitis without abscess: Secondary | ICD-10-CM

## 2016-04-07 DIAGNOSIS — Z87891 Personal history of nicotine dependence: Secondary | ICD-10-CM | POA: Insufficient documentation

## 2016-04-07 DIAGNOSIS — Z79899 Other long term (current) drug therapy: Secondary | ICD-10-CM | POA: Insufficient documentation

## 2016-04-07 MED ORDER — CEPHALEXIN 500 MG PO CAPS: 500.0000 mg | ORAL_CAPSULE | Freq: Four times a day (QID) | ORAL | 0 refills | Status: DC

## 2016-04-07 MED ORDER — SULFAMETHOXAZOLE-TRIMETHOPRIM 800-160 MG PO TABS: 1.0000 | ORAL_TABLET | Freq: Two times a day (BID) | ORAL | 0 refills | Status: AC

## 2016-04-07 NOTE — Discharge Instructions (Signed)
You have breast cellulitis, and possibly a small abscess.  - APPLY WARM COMPRESSES - TAKE THE ANTIBIOTICS - SEE THE BREAST SURGEON AS SOON AS POSSIBLE - RETURN TO THE ER IF SYMPTOMS GET WORSE - GET UP TO DATE WITH YOU MAMMOGRAMS

## 2016-04-07 NOTE — ED Triage Notes (Signed)
Pt  Complain of right breast abscess warm to touch with drainage x 4 days.

## 2016-04-10 NOTE — ED Provider Notes (Signed)
[facility] DEPT Provider Note   CSN: [id number] Arrival date & time: 04/07/16  0340     History   Chief Complaint Chief Complaint  Patient presents with  . Breast Pain    HPI Diana Blevins is a 51 y.o. female.  HPI  PT comes in with cc of breast pain. PT reports that she noticed breast pain 4 days ago. Over time, she has had increased swelling and redness. Now she has some drainage from her R breast. The drainage is not from the nipple. Pt had her last mammogram within the last 3-4 years. No n/v/f/c.   Past Medical History:  Diagnosis Date  . Acid reflux   . Chlamydia   . Fibroids   . Gonorrhea   . Hydrosalpinx   . Hypertension   . Neuropathy [facility])     Patient Active Problem List   Diagnosis Date Noted  . Abdominal pain 10/30/2015  . Hydrosalpinx right 10/30/2015    Past Surgical History:  Procedure Laterality Date  . COSMETIC SURGERY    . [place] as a child  . UNILATERAL SALPINGECTOMY     [facility] as a child    OB History    Gravida Para Term Preterm AB Living             0   SAB TAB Ectopic Multiple Live Births                   Home Medications    Prior to Admission medications   Medication Sig Start Date End Date Taking? Authorizing Provider  acetaminophen-codeine (TYLENOL #3) 300-30 MG tablet Take 2 tablets by mouth every 4 (four) hours as needed for moderate pain. 10/30/15   Jonnie Kind, MD  cephALEXin (KEFLEX) 500 MG capsule Take 1 capsule (500 mg total) by mouth 4 (four) times daily. 04/07/16   Varney Biles, MD  doxycycline (VIBRA-TABS) 100 MG tablet Take 1 tablet (100 mg total) by mouth every 12 (twelve) hours. 10/30/15   Jonnie Kind, MD  EPINEPHrine (EPI-PEN) 0.3 mg/0.3 mL SOAJ Stab the outer thigh and come to ED immediately if acute allergic reactions occur. 10/07/12   Lily Kocher, PA-C  metroNIDAZOLE (FLAGYL) 500 MG tablet Take 1 tablet (500 mg total) by mouth 2 (two) times daily. For  patient x 2 wk 10/30/15   Jonnie Kind, MD  sulfamethoxazole-trimethoprim (BACTRIM DS,SEPTRA DS) 800-160 MG tablet Take 1 tablet by mouth 2 (two) times daily. 04/07/16 04/14/16  Varney Biles, MD    Family History Family History  Problem Relation Age of Onset  . Hypertension Mother   . Diabetes Mother   . Hypertension Father     Social History Social History  Substance Use Topics  . Smoking status: Former Smoker    Packs/day: 0.01    Types: Cigarettes  . Smokeless tobacco: Never Used  . Alcohol use Yes     Comment: occas     Allergies   Bee venom; Dust mite extract; Latex; and Peanut-containing drug products   Review of Systems Review of Systems  ROS 10 Systems reviewed and are negative for acute change except as noted in the HPI.     Physical Exam Updated Vital Signs BP 117/89   Pulse 64   Temp 98 F (36.7 C) (Oral)   Resp 16   Ht 5\' 5"  (1.651 m)   Wt 240 lb (108.9 kg)   SpO2 99%   BMI  39.94 kg/m   Physical Exam  Constitutional: She is oriented to person, place, and time. She appears well-developed and well-nourished.  HENT:  Head: Normocephalic and atraumatic.  Eyes: EOM are normal. Pupils are equal, round, and reactive to light.  Neck: Neck supple.  Cardiovascular: Normal rate, regular rhythm and normal heart sounds.   No murmur heard. Pulmonary/Chest: Effort normal. No respiratory distress.  Abdominal: Soft. She exhibits no distension. There is no tenderness. There is no rebound and no guarding.  Neurological: She is alert and oriented to person, place, and time.  Skin: Skin is warm and dry. Rash noted. There is erythema.  Exam performed with chaperone.  Pt has edema, erythema of the R breast. The lesion is not around the areola / nipple.  PT has a fluctuant area in the middle of the lesion. There is no active drainage.  Nursing note and vitals reviewed.    ED Treatments / Results  Labs (all labs ordered are listed, but only abnormal results  are displayed) Labs Reviewed - No data to display  EKG  EKG Interpretation None       Radiology No results found.  Procedures Procedures (including critical care time)  Medications Ordered in ED Medications - No data to display   Initial Impression / Assessment and Plan / ED Course  I have reviewed the triage vital signs and the nursing notes.  Pertinent labs & imaging results that were available during my care of the patient were reviewed by me and considered in my medical decision making (see chart for details).    Pt has breast cellulitis, with likely an abscessed area. No systemic illness. Will start antibiotics and advise warm compresses. Pt advised to call Surgery and also return to the ER if the symptoms get worse.  Final Clinical Impressions(s) / ED Diagnoses   Final diagnoses:  Cellulitis of female breast    New Prescriptions Discharge Medication List as of 04/07/2016  5:28 AM    START taking these medications   Details  cephALEXin (KEFLEX) 500 MG capsule Take 1 capsule (500 mg total) by mouth 4 (four) times daily., Starting Fri 04/07/2016, Print    sulfamethoxazole-trimethoprim (BACTRIM DS,SEPTRA DS) 800-160 MG tablet Take 1 tablet by mouth 2 (two) times daily., Starting Fri 04/07/2016, Until Fri 04/14/2016, Print         Varney Biles, MD 04/10/16 [phone]

## 2016-05-30 IMAGING — CR DG CHEST 2V
2 series · 2 of 2 positions shown · non-contrast
Comparison: 05/04/2014

CLINICAL DATA: Nonobstructive cough 4 days

EXAM:
CHEST  2 VIEW

[view not recorded (1 of 2)]
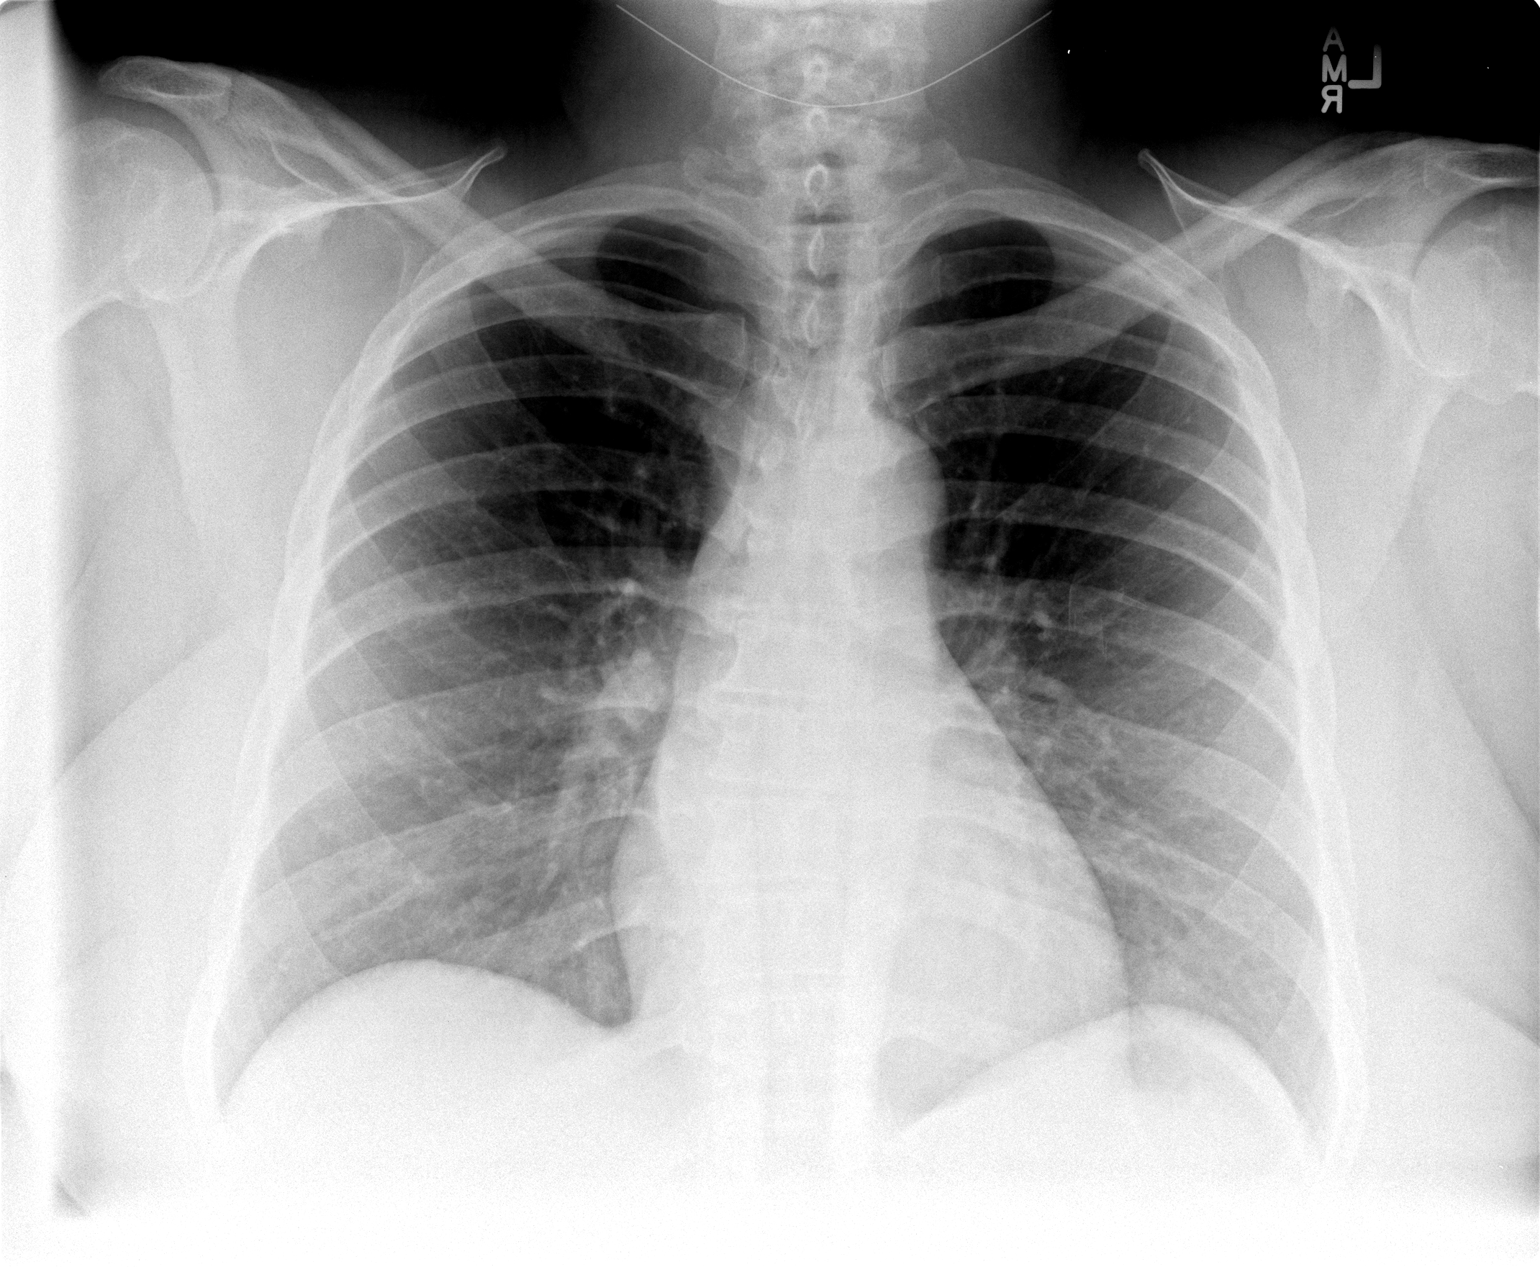

[view not recorded (2 of 2)]
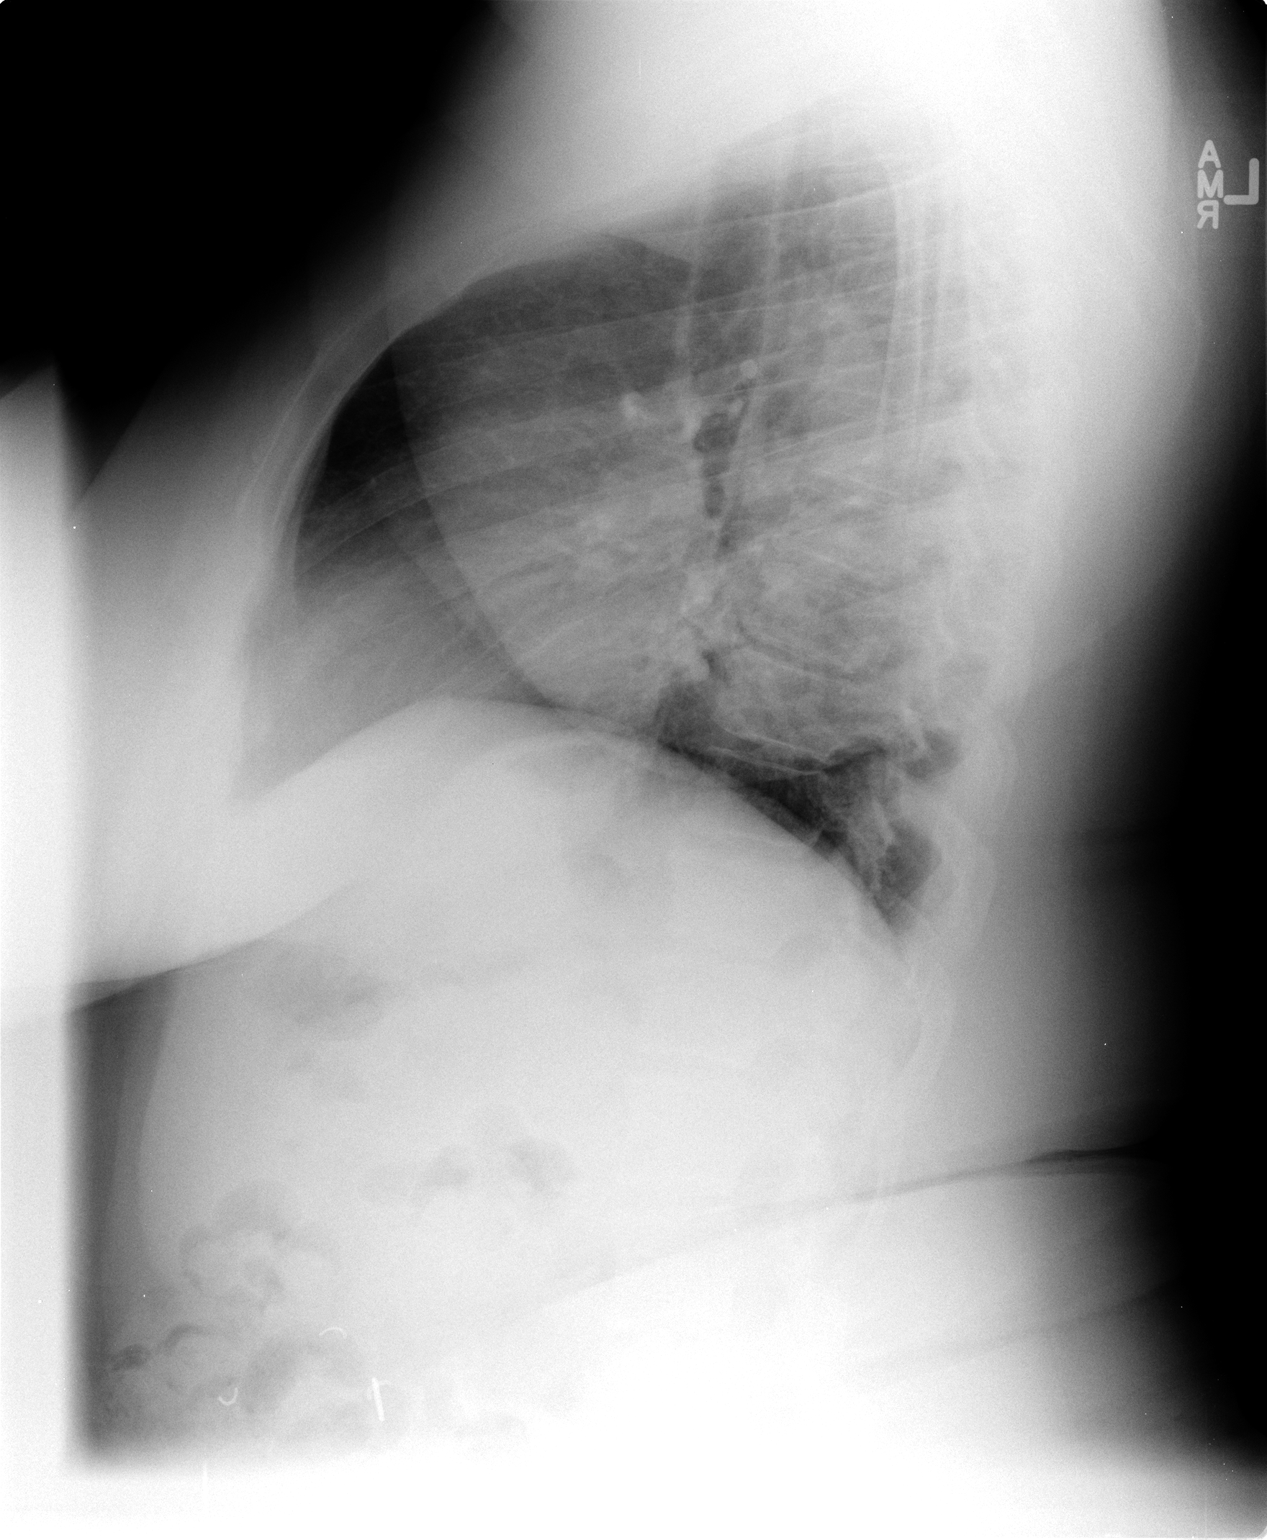

[2 of 2 positions shown; findings below may reference images not displayed]

FINDINGS: The heart size and mediastinal contours are within normal limits.
Both lungs are clear. The visualized skeletal structures are
unremarkable.
IMPRESSION: No active cardiopulmonary disease.
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
## 2016-08-08 ENCOUNTER — Encounter: Payer: Self-pay | Admitting: Physician Assistant

## 2016-08-08 ENCOUNTER — Ambulatory Visit: Payer: Self-pay | Admitting: Physician Assistant

## 2016-08-08 VITALS — BP 146/84 | HR 64 | Temp 97.7°F | Ht 64.5 in | Wt 247.0 lb

## 2016-08-08 DIAGNOSIS — I10 Essential (primary) hypertension: Secondary | ICD-10-CM

## 2016-08-08 MED ORDER — TRIAMTERENE-HCTZ 37.5-25 MG PO TABS: 1.0000 | ORAL_TABLET | Freq: Every day | ORAL | 1 refills | Status: DC

## 2016-08-08 NOTE — Progress Notes (Signed)
BP (!) 146/84 (BP Location: Right Arm, Patient Position: Sitting, Cuff Size: Large)   Pulse 64   Temp 97.7 F (36.5 C) (Other (Comment))   Ht 5' 4.5" (1.638 m)   Wt 247 lb (112 kg)   SpO2 98%   BMI 41.74 kg/m    Subjective:    Patient ID: Diana Blevins, female    DOB: 02-28-1966, 51 y.o.   MRN: [id number]  HPI: Diana Blevins is a 51 y.o. female presenting on 08/08/2016 for New Patient (Initial Visit) ( c/o toothache)   HPI   Pt was being seen at [facility] but wants to come here now. She was last seen there in February, she thinks.    Pt was on triamterene-hct 37.5 but she ran out of it about a week ago and has been taking some leftover atenolol.  The atenolol had been discontinued due to bradycardia.  Pt says she tolerated the triamterene-hct well.    Pt has family planning medicaid.  Relevant past medical, surgical, family and social history reviewed and updated as indicated. Interim medical history since our last visit reviewed. Allergies and medications reviewed and updated.   Current Outpatient Prescriptions:  .  ATENOLOL PO, Take by mouth., Disp: , Rfl:    Review of Systems  Constitutional: Negative for appetite change, chills, diaphoresis, fatigue, fever and unexpected weight change.  HENT: Positive for congestion, dental problem, mouth sores and sneezing. Negative for drooling, ear pain, facial swelling, hearing loss, sore throat, trouble swallowing and voice change.   Eyes: Positive for discharge, redness and itching. Negative for pain and visual disturbance.  Respiratory: Positive for cough and wheezing. Negative for choking and shortness of breath.   Cardiovascular: Negative for chest pain, palpitations and leg swelling.  Gastrointestinal: Negative for abdominal pain, blood in stool, constipation, diarrhea and vomiting.  Endocrine: Negative for cold intolerance, heat intolerance and polydipsia.  Genitourinary: Negative for decreased urine volume, dysuria and  hematuria.  Musculoskeletal: Positive for arthralgias and back pain. Negative for gait problem.  Skin: Positive for rash.  Allergic/Immunologic: Positive for environmental allergies.  Neurological: Negative for seizures, syncope, light-headedness and headaches.  Hematological: Negative for adenopathy.  Psychiatric/Behavioral: Negative for agitation, dysphoric mood and suicidal ideas. The patient is not nervous/anxious.     Per HPI unless specifically indicated above     Objective:    BP (!) 146/84 (BP Location: Right Arm, Patient Position: Sitting, Cuff Size: Large)   Pulse 64   Temp 97.7 F (36.5 C) (Other (Comment))   Ht 5' 4.5" (1.638 m)   Wt 247 lb (112 kg)   SpO2 98%   BMI 41.74 kg/m   Wt Readings from Last 3 Encounters:  08/08/16 247 lb (112 kg)  07/27/16 247 lb 3.2 oz (112.1 kg)  04/07/16 240 lb (108.9 kg)    Physical Exam  Constitutional: She is oriented to person, place, and time. She appears well-developed and well-nourished.  HENT:  Head: Normocephalic and atraumatic.  Mouth/Throat: Oropharynx is clear and moist. No oropharyngeal exudate.  Eyes: Conjunctivae and EOM are normal. Pupils are equal, round, and reactive to light.  Neck: Neck supple. No thyromegaly present.  Cardiovascular: Normal rate and regular rhythm.   Pulmonary/Chest: Effort normal and breath sounds normal.  Abdominal: Soft. Bowel sounds are normal. She exhibits no mass. There is no hepatosplenomegaly. There is no tenderness.  Musculoskeletal: She exhibits no edema.  Lymphadenopathy:    She has no cervical adenopathy.  Neurological: She is alert and oriented  to person, place, and time. Gait normal.  Skin: Skin is warm and dry.  Psychiatric: She has a normal mood and affect. Her behavior is normal.  Vitals reviewed.       Assessment & Plan:   Encounter Diagnoses  Name Primary?  . Essential hypertension Yes  . Morbid obesity ([place])     -rx triamterence-hct and stop the atenolol -pt to  get baseline labs -record request sent for most recent PAP and mammogram -pt to follow up in one month to review labs and recheck bp.  RTO sooner prn

## 2016-08-09 ENCOUNTER — Telehealth: Payer: Self-pay

## 2016-08-09 NOTE — Telephone Encounter (Signed)
Called to follow up with client after [facility]. Client states that her appointment went well and that she is getting labs drawn tomorrow and will be seen again in 1 month.  No further questions at this time, but client has RN contact information if she has any further questions.

## 2016-09-05 ENCOUNTER — Ambulatory Visit: Payer: Self-pay | Admitting: Physician Assistant

## 2016-09-11 ENCOUNTER — Encounter: Payer: Self-pay | Admitting: Physician Assistant

## 2017-03-01 ENCOUNTER — Ambulatory Visit: Payer: Self-pay | Admitting: Physician Assistant

## 2017-03-01 ENCOUNTER — Encounter: Payer: Self-pay | Admitting: Physician Assistant

## 2017-03-01 ENCOUNTER — Other Ambulatory Visit: Payer: Self-pay | Admitting: Physician Assistant

## 2017-03-01 VITALS — BP 120/78 | HR 59 | Temp 97.5°F | Ht 64.5 in | Wt 246.0 lb

## 2017-03-01 DIAGNOSIS — Z13220 Encounter for screening for lipoid disorders: Secondary | ICD-10-CM

## 2017-03-01 DIAGNOSIS — Z9119 Patient's noncompliance with other medical treatment and regimen: Secondary | ICD-10-CM

## 2017-03-01 DIAGNOSIS — Z1211 Encounter for screening for malignant neoplasm of colon: Secondary | ICD-10-CM

## 2017-03-01 DIAGNOSIS — I10 Essential (primary) hypertension: Secondary | ICD-10-CM | POA: Insufficient documentation

## 2017-03-01 DIAGNOSIS — Z1239 Encounter for other screening for malignant neoplasm of breast: Secondary | ICD-10-CM

## 2017-03-01 DIAGNOSIS — Z91199 Patient's noncompliance with other medical treatment and regimen due to unspecified reason: Secondary | ICD-10-CM | POA: Insufficient documentation

## 2017-03-01 DIAGNOSIS — R9431 Abnormal electrocardiogram [ECG] [EKG]: Secondary | ICD-10-CM

## 2017-03-01 DIAGNOSIS — Z131 Encounter for screening for diabetes mellitus: Secondary | ICD-10-CM

## 2017-03-01 MED ORDER — TRIAMTERENE-HCTZ 37.5-25 MG PO TABS: 1.0000 | ORAL_TABLET | Freq: Every day | ORAL | 1 refills | Status: DC

## 2017-03-01 NOTE — Progress Notes (Signed)
BP 120/78 (BP Location: Left Arm, Patient Position: Sitting, Cuff Size: Large)   Pulse (!) 59   Temp (!) 97.5 F (36.4 C) (Other (Comment))   Ht 5' 4.5" (1.638 m)   Wt 246 lb (111.6 kg)   SpO2 98%   BMI 41.57 kg/m    Subjective:    Patient ID: Diana Blevins, female    DOB: 01-18-1966, 51 y.o.   MRN: [id number]  HPI: Diana Blevins is a 51 y.o. female presenting on 03/01/2017 for Follow-up   HPI  Pt was due to [place] in June and is just now coming back in to be seen.   Pt says she takes the bp medication here and there and not regularly.  She took her last pill yesterday from a bottle filled 12/26/16.   She went through only 2 bottles (60 tabs) in 6 months.   Pt is worried about her heart because her aunt had to have surgery.  Pt has had no CP.  Pt says she used to smoke quite a bit but does not smoke any longer.   Relevant past medical, surgical, family and social history reviewed and updated as indicated. Interim medical history since our last visit reviewed. Allergies and medications reviewed and updated.   Current Outpatient Medications:  .  omeprazole (PRILOSEC) 20 MG capsule, Take 20 mg by mouth daily as needed., Disp: , Rfl:  .  triamterene-hydrochlorothiazide (MAXZIDE-25) 37.5-25 MG tablet, Take 1 tablet by mouth daily. (Patient not taking: Reported on 03/01/2017), Disp: 30 tablet, Rfl: 1   Review of Systems  Constitutional: Negative for appetite change, chills, diaphoresis, fatigue, fever and unexpected weight change.  HENT: Positive for dental problem, facial swelling and sneezing. Negative for congestion, drooling, ear pain, hearing loss, mouth sores, sore throat, trouble swallowing and voice change.   Eyes: Positive for discharge, redness, itching and visual disturbance. Negative for pain.  Respiratory: Positive for cough. Negative for choking, shortness of breath and wheezing.   Cardiovascular: Negative for chest pain, palpitations and leg swelling.   Gastrointestinal: Positive for abdominal pain. Negative for blood in stool, constipation, diarrhea and vomiting.  Endocrine: Negative for cold intolerance, heat intolerance and polydipsia.  Genitourinary: Negative for decreased urine volume, dysuria and hematuria.  Musculoskeletal: Positive for back pain. Negative for arthralgias and gait problem.  Skin: Positive for rash.  Allergic/Immunologic: Negative for environmental allergies.  Neurological: Negative for seizures, syncope, light-headedness and headaches.  Hematological: Negative for adenopathy.  Psychiatric/Behavioral: Positive for agitation. Negative for dysphoric mood and suicidal ideas. The patient is not nervous/anxious.     Per HPI unless specifically indicated above     Objective:    BP 120/78 (BP Location: Left Arm, Patient Position: Sitting, Cuff Size: Large)   Pulse (!) 59   Temp (!) 97.5 F (36.4 C) (Other (Comment))   Ht 5' 4.5" (1.638 m)   Wt 246 lb (111.6 kg)   SpO2 98%   BMI 41.57 kg/m   Wt Readings from Last 3 Encounters:  03/01/17 246 lb (111.6 kg)  08/08/16 247 lb (112 kg)  07/27/16 247 lb 3.2 oz (112.1 kg)    Physical Exam  Constitutional: She is oriented to person, place, and time. She appears well-developed and well-nourished.  HENT:  Head: Normocephalic and atraumatic.  Neck: Neck supple.  Cardiovascular: Normal rate and regular rhythm.  Pulmonary/Chest: Effort normal and breath sounds normal.  Abdominal: Soft. Bowel sounds are normal. She exhibits no mass. There is no hepatosplenomegaly. There is no  tenderness.  Musculoskeletal: She exhibits no edema.  Lymphadenopathy:    She has no cervical adenopathy.  Neurological: She is alert and oriented to person, place, and time.  Skin: Skin is warm and dry.  Psychiatric: She has a normal mood and affect. Her behavior is normal.  Vitals reviewed.   EKG- sinus rhythm at 59 bpm.  T waves upside down in lead III.  This is a change compared with EKG  done 05/07/12.       Assessment & Plan:    Encounter Diagnoses  Name Primary?  . Essential hypertension Yes  . Abnormal EKG   . Personal history of noncompliance with medical treatment, presenting hazards to health   . Morbid obesity ([place])   . Screening for breast cancer   . Screening for colon cancer   . Screening for diabetes mellitus   . Screening cholesterol level      -pt counseled about need for taking care of herself which includes taking her medicine every day and coming in to her scheduled appointments -gave pt iFOBT for colon cancer screening -pt to get fasting Labs drawn tomorrow morning -will refer for screening Mammogram -Record request sent for most recent PAP -will sent pt for EST due to ekg changes -pt was given [facility] discount application -pt to follow up 1 month.  RTO sooner prn

## 2017-03-05 ENCOUNTER — Other Ambulatory Visit (HOSPITAL_COMMUNITY)
Admission: RE | Admit: 2017-03-05 | Discharge: 2017-03-05 | Disposition: A | Discharge status: Home / Self Care | Payer: Self-pay | Source: Ambulatory Visit | Attending: Physician Assistant | Admitting: Physician Assistant

## 2017-03-05 ENCOUNTER — Inpatient Hospital Stay (HOSPITAL_COMMUNITY): Admit: 2017-03-05 | Discharge: 2017-03-05 | Disposition: A | Discharge status: Home / Self Care | Payer: Self-pay

## 2017-03-05 DIAGNOSIS — Z131 Encounter for screening for diabetes mellitus: Secondary | ICD-10-CM

## 2017-03-05 DIAGNOSIS — I10 Essential (primary) hypertension: Secondary | ICD-10-CM

## 2017-03-05 DIAGNOSIS — Z13220 Encounter for screening for lipoid disorders: Secondary | ICD-10-CM

## 2017-03-05 LAB — COMPREHENSIVE METABOLIC PANEL
ALK PHOS: 44 U/L (ref 38–126)
ALT: 8 U/L — ABNORMAL LOW (ref 14–54)
AST: 16 U/L (ref 15–41)
Albumin: 3.9 g/dL (ref 3.5–5.0)
Anion gap: 9 (ref 5–15)
BILIRUBIN TOTAL: 0.7 mg/dL (ref 0.3–1.2)
BUN: 12 mg/dL (ref 6–20)
CALCIUM: 9.2 mg/dL (ref 8.9–10.3)
CO2: 25 mmol/L (ref 22–32)
CREATININE: 0.58 mg/dL (ref 0.44–1.00)
Chloride: 102 mmol/L (ref 101–111)
GFR calc Af Amer: >60 mL/min (ref >60)
Glucose, Bld: 91 mg/dL (ref 65–99)
Potassium: 4.2 mmol/L (ref 3.5–5.1)
Sodium: 136 mmol/L (ref 135–145)
TOTAL PROTEIN: 7.6 g/dL (ref 6.5–8.1)

## 2017-03-05 LAB — LIPID PANEL
CHOLESTEROL: 198 mg/dL (ref 0–200)
HDL: 97 mg/dL (ref >40)
LDL Cholesterol: 91 mg/dL (ref 0–99)
TRIGLYCERIDES: 52 mg/dL (ref <150)
Total CHOL/HDL Ratio: 2 RATIO
VLDL: 10 mg/dL (ref 0–40)

## 2017-03-05 LAB — HEMOGLOBIN A1C
Hgb A1c MFr Bld: 5.3 % (ref 4.8–5.6)
MEAN PLASMA GLUCOSE: 105.41 mg/dL

## 2017-03-14 ENCOUNTER — Encounter: Payer: Self-pay | Admitting: Physician Assistant

## 2017-03-14 LAB — IFOBT (OCCULT BLOOD): IFOBT: POSITIVE

## 2017-03-26 ENCOUNTER — Encounter: Payer: Self-pay | Admitting: Internal Medicine

## 2017-04-03 ENCOUNTER — Ambulatory Visit: Payer: Self-pay | Admitting: Physician Assistant

## 2017-04-16 ENCOUNTER — Encounter: Payer: Self-pay | Admitting: Physician Assistant

## 2017-05-09 ENCOUNTER — Encounter: Payer: Self-pay | Admitting: Nurse Practitioner

## 2017-05-09 ENCOUNTER — Ambulatory Visit: Payer: Self-pay | Admitting: Nurse Practitioner

## 2017-05-09 ENCOUNTER — Telehealth: Payer: Self-pay | Admitting: Nurse Practitioner

## 2017-05-09 NOTE — Telephone Encounter (Signed)
PATIENT WAS A NO SHOW AND LETTER SENT  °

## 2017-05-10 NOTE — Telephone Encounter (Signed)
Noted  

## 2017-05-21 ENCOUNTER — Other Ambulatory Visit (HOSPITAL_COMMUNITY)
Admission: RE | Admit: 2017-05-21 | Discharge: 2017-05-21 | Disposition: A | Discharge status: Home / Self Care | Payer: Self-pay | Source: Ambulatory Visit | Attending: Physician Assistant | Admitting: Physician Assistant

## 2017-05-21 ENCOUNTER — Encounter: Payer: Self-pay | Admitting: Physician Assistant

## 2017-05-21 ENCOUNTER — Ambulatory Visit: Payer: Self-pay | Admitting: Physician Assistant

## 2017-05-21 VITALS — BP 154/85 | HR 63 | Temp 97.6°F | Ht 64.5 in | Wt 239.5 lb

## 2017-05-21 DIAGNOSIS — Z1239 Encounter for other screening for malignant neoplasm of breast: Secondary | ICD-10-CM

## 2017-05-21 DIAGNOSIS — K219 Gastro-esophageal reflux disease without esophagitis: Secondary | ICD-10-CM

## 2017-05-21 DIAGNOSIS — Z91199 Patient's noncompliance with other medical treatment and regimen due to unspecified reason: Secondary | ICD-10-CM

## 2017-05-21 DIAGNOSIS — Z9119 Patient's noncompliance with other medical treatment and regimen: Secondary | ICD-10-CM

## 2017-05-21 DIAGNOSIS — Z9109 Other allergy status, other than to drugs and biological substances: Secondary | ICD-10-CM

## 2017-05-21 DIAGNOSIS — I10 Essential (primary) hypertension: Secondary | ICD-10-CM

## 2017-05-21 DIAGNOSIS — R195 Other fecal abnormalities: Secondary | ICD-10-CM

## 2017-05-21 DIAGNOSIS — R9431 Abnormal electrocardiogram [ECG] [EKG]: Secondary | ICD-10-CM

## 2017-05-21 LAB — BASIC METABOLIC PANEL
Anion gap: 10 (ref 5–15)
BUN: 15 mg/dL (ref 6–20)
CALCIUM: 9.5 mg/dL (ref 8.9–10.3)
CO2: 23 mmol/L (ref 22–32)
CREATININE: 0.51 mg/dL (ref 0.44–1.00)
Chloride: 103 mmol/L (ref 101–111)
GFR calc non Af Amer: >60 mL/min (ref >60)
Glucose, Bld: 97 mg/dL (ref 65–99)
Potassium: 4.1 mmol/L (ref 3.5–5.1)
SODIUM: 136 mmol/L (ref 135–145)

## 2017-05-21 MED ORDER — LORATADINE 10 MG PO TABS: 10.0000 mg | ORAL_TABLET | Freq: Every day | ORAL | 1 refills | Status: DC

## 2017-05-21 MED ORDER — TRIAMTERENE-HCTZ 37.5-25 MG PO TABS: 1.0000 | ORAL_TABLET | Freq: Every day | ORAL | 1 refills | Status: DC

## 2017-05-21 MED ORDER — OMEPRAZOLE 40 MG PO CPDR: 40.0000 mg | DELAYED_RELEASE_CAPSULE | Freq: Every day | ORAL | 1 refills | Status: DC

## 2017-05-21 NOTE — Progress Notes (Signed)
ea  BP (!) 154/85 (BP Location: Right Arm, Patient Position: Sitting, Cuff Size: Large)   Pulse 63   Temp 97.6 F (36.4 C) (Oral)   Ht 5' 4.5" (1.638 m)   Wt 239 lb 8 oz (108.6 kg)   SpO2 100%   BMI 40.48 kg/m    Subjective:    Patient ID: Diana Blevins, female    DOB: 20-Mar-1965, 52 y.o.   MRN: [id number]  HPI: Diana Blevins is a 52 y.o. female presenting on 05/21/2017 for Follow-up   HPI   Pt says she takes her triameteren-hctz, but not every day. She says she takes it "sometimes"  Pt was no-show to GI for referred due to hemoccult positive.  EST was ordered 03/01/17 but not scheduled  Pt was "working at [employer]" but says she is not working there now.    Pt is not not sure but thinks she turned in her charity care application   Pt denies CP but says she has gas a lot- states mostly farting and burping but sometimes pain.   Relevant past medical, surgical, family and social history reviewed and updated as indicated. Interim medical history since our last visit reviewed. Allergies and medications reviewed and updated.   Current Outpatient Medications:  .  DiphenhydrAMINE HCl (BENADRYL ALLERGY PO), Take by mouth., Disp: , Rfl:  .  triamterene-hydrochlorothiazide (MAXZIDE-25) 37.5-25 MG tablet, Take 1 tablet by mouth daily., Disp: 30 tablet, Rfl: 1   Review of Systems  Constitutional: Negative for appetite change, chills, diaphoresis, fatigue, fever and unexpected weight change.  HENT: Positive for dental problem. Negative for congestion, drooling, ear pain, facial swelling, hearing loss, mouth sores, sneezing, sore throat, trouble swallowing and voice change.   Eyes: Positive for discharge, itching and visual disturbance. Negative for pain and redness.  Respiratory: Negative for cough, choking, shortness of breath and wheezing.   Cardiovascular: Negative for chest pain, palpitations and leg swelling.  Gastrointestinal: Positive for abdominal pain.  Negative for blood in stool, constipation, diarrhea and vomiting.  Endocrine: Negative for cold intolerance, heat intolerance and polydipsia.  Genitourinary: Negative for decreased urine volume, dysuria and hematuria.  Musculoskeletal: Positive for arthralgias. Negative for back pain and gait problem.  Skin: Negative for rash.  Allergic/Immunologic: Negative for environmental allergies.  Neurological: Negative for seizures, syncope, light-headedness and headaches.  Hematological: Negative for adenopathy.  Psychiatric/Behavioral: Negative for agitation, dysphoric mood and suicidal ideas. The patient is not nervous/anxious.     Per HPI unless specifically indicated above     Objective:    BP (!) 154/85 (BP Location: Right Arm, Patient Position: Sitting, Cuff Size: Large)   Pulse 63   Temp 97.6 F (36.4 C) (Oral)   Ht 5' 4.5" (1.638 m)   Wt 239 lb 8 oz (108.6 kg)   SpO2 100%   BMI 40.48 kg/m   Wt Readings from Last 3 Encounters:  05/21/17 239 lb 8 oz (108.6 kg)  03/01/17 246 lb (111.6 kg)  08/08/16 247 lb (112 kg)    Physical Exam  Constitutional: She is oriented to person, place, and time. She appears well-developed and well-nourished.  HENT:  Head: Normocephalic and atraumatic.  Neck: Neck supple.  Cardiovascular: Normal rate and regular rhythm.  Pulmonary/Chest: Effort normal and breath sounds normal.  Abdominal: Soft. Bowel sounds are normal. She exhibits no mass. There is no hepatosplenomegaly. There is no tenderness.  Musculoskeletal: She exhibits no edema.  Lymphadenopathy:    She has no cervical adenopathy.  Neurological: She is alert and oriented to person, place, and time.  Skin: Skin is warm and dry.  Psychiatric: She has a normal mood and affect. Her behavior is normal.  Vitals reviewed.    EKG- still with T abnormal inferior leads- no change from EKG done in december     Assessment & Plan:    Encounter Diagnoses  Name Primary?  . Essential  hypertension Yes  . Personal history of noncompliance with medical treatment, presenting hazards to health   . Screening for breast cancer   . Positive fecal occult blood test   . Abnormal EKG   . Morbid obesity ([place])   . Environmental allergies   . Gastroesophageal reflux disease, esophagitis presence not specified     -PAP due July -ordered screening Mammogram -Update bmp- pt to go to lab today when leaves office -pt counseled toTake bp med daily -pt to call to reschedule with GI -will have nurse check on scheduling EST -rx claritin per pt request for her seasonal allergies -rx omeprazole for gerd and "gas pains" -pt to follow up 1 month to recheck bp

## 2017-05-21 NOTE — Patient Instructions (Addendum)
[facility]  [phone]

## 2017-05-24 ENCOUNTER — Encounter: Payer: Self-pay | Admitting: *Deleted

## 2017-05-24 ENCOUNTER — Encounter: Payer: Self-pay | Admitting: Nurse Practitioner

## 2017-05-24 ENCOUNTER — Ambulatory Visit (INDEPENDENT_AMBULATORY_CARE_PROVIDER_SITE_OTHER): Payer: Self-pay | Admitting: Nurse Practitioner

## 2017-05-24 ENCOUNTER — Other Ambulatory Visit: Payer: Self-pay | Admitting: *Deleted

## 2017-05-24 ENCOUNTER — Telehealth: Payer: Self-pay | Admitting: *Deleted

## 2017-05-24 VITALS — BP 154/87 | HR 81 | Temp 97.8°F | Ht 65.0 in | Wt 240.0 lb

## 2017-05-24 DIAGNOSIS — R195 Other fecal abnormalities: Secondary | ICD-10-CM

## 2017-05-24 DIAGNOSIS — R1030 Lower abdominal pain, unspecified: Secondary | ICD-10-CM

## 2017-05-24 DIAGNOSIS — K5900 Constipation, unspecified: Secondary | ICD-10-CM

## 2017-05-24 MED ORDER — PEG 3350-KCL-NA BICARB-NACL 420 G PO SOLR: 4000.0000 mL | Freq: Once | ORAL | 0 refills | Status: AC

## 2017-05-24 NOTE — Progress Notes (Signed)
CC'ED TO PCP 

## 2017-05-24 NOTE — Assessment & Plan Note (Signed)
The patient will sometimes go to 3 days between bowel movements.  She admits occasional straining.  Likely an element of constipation although not severe.  I will have her start Colace every day to every other day to help with adequate bowel movements.  This would likely improve her "gas pains".  She is due for first-ever colonoscopy as per above.  Follow-up in 3 months.

## 2017-05-24 NOTE — Assessment & Plan Note (Signed)
The patient is 52 years old, has never had a colonoscopy before.  Stool blood test at her primary care was positive for heme.  She is due for first-ever colonoscopy regardless, although we will proceed with a diagnostic colonoscopy related to heme positive stool.  She does have some constipation as per above and this could be causing benign anorectal bleeding.  However, bleeding polyp, less likely colorectal cancer and inflammatory bowel disease cannot be excluded.  We will proceed with colonoscopy at this time to further evaluate.  Follow-up in 3 months.  Call if any worsening problems before then.  Proceed with TCS on propofol/MAC with Dr. Gala Romney in near future: the risks, benefits, and alternatives have been discussed with the patient in detail. The patient states understanding and desires to proceed.  Patient is not on any anticoagulants, anxiolytics, chronic pain medications, or antidepressants.  She drinks occasional alcohol, uses marijuana a couple times a week.  We will plan for propofol/MAC to promote adequate sedation.

## 2017-05-24 NOTE — Patient Instructions (Signed)
1. Try taking Colace stool softener, which is available over-the-counter, once a day to once every other day depending on your result.  This should help you have better bowel movements with less straining. 2. We will schedule your colonoscopy for you. 3. Further recommendations will be made after your colonoscopy. 4. Follow-up in 3 months. 5. Call us if you have any questions or concerns.  Have a Great Summer!!
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
## 2017-05-24 NOTE — Telephone Encounter (Signed)
Pre-op scheduled for 06/14/17 at 1:45pm. Letter mailed. VM is not set up when I called patient.

## 2017-05-24 NOTE — Assessment & Plan Note (Signed)
The patient describes mild abdominal pain.  She relates this is likely to gas and states she eats a lot of beans.  She is mildly tender on exam.  She likely has an element of constipation as per above which is possibly contributing.  She is due for colonoscopy and is having rectal bleeding which we will proceed, as per above.  This will allow for further workup of possible etiologies behind her abdominal pain.  Return for follow-up in 3 months.

## 2017-05-25 ENCOUNTER — Other Ambulatory Visit: Payer: Self-pay | Admitting: Physician Assistant

## 2017-05-25 DIAGNOSIS — Z1231 Encounter for screening mammogram for malignant neoplasm of breast: Secondary | ICD-10-CM

## 2017-06-12 NOTE — Patient Instructions (Signed)
Diana Blevins  06/12/2017     @PREFPERIOPPHARMACY @   Your procedure is scheduled on  06/21/2017   Report to [facility] at  1215   P.M.  Call this number if you have problems the morning of surgery:  [phone]   Remember:  Do not eat food or drink liquids after midnight.  Take these medicines the morning of surgery with A SIP OF WATER  maxzide   Do not wear jewelry, make-up or nail polish.  Do not wear lotions, powders, or perfumes, or deodorant.  Do not shave 48 hours prior to surgery.  Men may shave face and neck.  Do not bring valuables to the hospital.  [facility] is not responsible for any belongings or valuables.  Contacts, dentures or bridgework may not be worn into surgery.  Leave your suitcase in the car.  After surgery it may be brought to your room.  For patients admitted to the hospital, discharge time will be determined by your treatment team.  Patients discharged the day of surgery will not be allowed to drive home.   Name and phone number of your driver:   family Special instructions:  Follow the diet and prep instructions given to you by Dr Roseanne Kaufman office.  Please read over the following fact sheets that you were given. Anesthesia Post-op Instructions and Care and Recovery After Surgery       Colonoscopy, Adult A colonoscopy is an exam to look at the large intestine. It is done to check for problems, such as:  Lumps (tumors).  Growths (polyps).  Swelling (inflammation).  Bleeding.  What happens before the procedure? Eating and drinking Follow instructions from your doctor about eating and drinking. These instructions may include:  A few days before the procedure - follow a low-fiber diet. ? Avoid nuts. ? Avoid seeds. ? Avoid dried fruit. ? Avoid raw fruits. ? Avoid vegetables.  1-3 days before the procedure - follow a clear liquid diet. Avoid liquids that have red or purple dye. Drink only clear liquids, such  as: ? Clear broth or bouillon. ? Black coffee or tea. ? Clear juice. ? Clear soft drinks or sports drinks. ? Gelatin dessert. ? Popsicles.  On the day of the procedure - do not eat or drink anything during the 2 hours before the procedure.  Bowel prep If you were prescribed an oral bowel prep:  Take it as told by your doctor. Starting the day before your procedure, you will need to drink a lot of liquid. The liquid will cause you to poop (have bowel movements) until your poop is almost clear or light green.  If your skin or butt gets irritated from diarrhea, you may: ? Wipe the area with wipes that have medicine in them, such as adult wet wipes with aloe and vitamin E. ? Put something on your skin that soothes the area, such as petroleum jelly.  If you throw up (vomit) while drinking the bowel prep, take a break for up to 60 minutes. Then begin the bowel prep again. If you keep throwing up and you cannot take the bowel prep without throwing up, call your doctor.  General instructions  Ask your doctor about changing or stopping your normal medicines. This is important if you take diabetes medicines or blood thinners.  Plan to have someone take you home from the hospital or clinic. What happens during the procedure?  An IV tube may be  put into one of your veins.  You will be given medicine to help you relax (sedative).  To reduce your risk of infection: ? Your doctors will wash their hands. ? Your anal area will be washed with soap.  You will be asked to lie on your side with your knees bent.  Your doctor will get a long, thin, flexible tube ready. The tube will have a camera and a light on the end.  The tube will be put into your anus.  The tube will be gently put into your large intestine.  Air will be delivered into your large intestine to keep it open. You may feel some pressure or cramping.  The camera will be used to take photos.  A small tissue sample may be  removed from your body to be looked at under a microscope (biopsy). If any possible problems are found, the tissue will be sent to a lab for testing.  If small growths are found, your doctor may remove them and have them checked for cancer.  The tube that was put into your anus will be slowly removed. The procedure may vary among doctors and hospitals. What happens after the procedure?  Your doctor will check on you often until the medicines you were given have worn off.  Do not drive for 24 hours after the procedure.  You may have a small amount of blood in your poop.  You may pass gas.  You may have mild cramps or bloating in your belly (abdomen).  It is up to you to get the results of your procedure. Ask your doctor, or the department performing the procedure, when your results will be ready. This information is not intended to replace advice given to you by your health care provider. Make sure you discuss any questions you have with your health care provider. Document Released: 04/01/2010 Document Revised: 12/29/2015 Document Reviewed: 05/11/2015 Elsevier Interactive Patient Education  2017 [employer]  Colonoscopy, Adult, Care After This sheet gives you information about how to care for yourself after your procedure. Your health care provider may also give you more specific instructions. If you have problems or questions, contact your health care provider. What can I expect after the procedure? After the procedure, it is common to have:  A small amount of blood in your stool for 24 hours after the procedure.  Some gas.  Mild abdominal cramping or bloating.  Follow these instructions at home: General instructions   For the first 24 hours after the procedure: ? Do not drive or use machinery. ? Do not sign important documents. ? Do not drink alcohol. ? Do your regular daily activities at a slower pace than normal. ? Eat soft, easy-to-digest foods. ? Rest  often.  Take over-the-counter or prescription medicines only as told by your health care provider.  It is up to you to get the results of your procedure. Ask your health care provider, or the department performing the procedure, when your results will be ready. Relieving cramping and bloating  Try walking around when you have cramps or feel bloated.  Apply heat to your abdomen as told by your health care provider. Use a heat source that your health care provider recommends, such as a moist heat pack or a heating pad. ? Place a towel between your skin and the heat source. ? Leave the heat on for 20-30 minutes. ? Remove the heat if your skin turns bright red. This is especially important if you are  unable to feel pain, heat, or cold. You may have a greater risk of getting burned. Eating and drinking  Drink enough fluid to keep your urine clear or pale yellow.  Resume your normal diet as instructed by your health care provider. Avoid heavy or fried foods that are hard to digest.  Avoid drinking alcohol for as long as instructed by your health care provider. Contact a health care provider if:  You have blood in your stool 2-3 days after the procedure. Get help right away if:  You have more than a small spotting of blood in your stool.  You pass large blood clots in your stool.  Your abdomen is swollen.  You have nausea or vomiting.  You have a fever.  You have increasing abdominal pain that is not relieved with medicine. This information is not intended to replace advice given to you by your health care provider. Make sure you discuss any questions you have with your health care provider. Document Released: 10/12/2003 Document Revised: 11/22/2015 Document Reviewed: 05/11/2015 Elsevier Interactive Patient Education  2018 [place] Anesthesia is a term that refers to techniques, procedures, and medicines that help a person stay safe and comfortable  during a medical procedure. Monitored anesthesia care, or sedation, is one type of anesthesia. Your anesthesia specialist may recommend sedation if you will be having a procedure that does not require you to be unconscious, such as:  Cataract surgery.  A dental procedure.  A biopsy.  A colonoscopy.  During the procedure, you may receive a medicine to help you relax (sedative). There are three levels of sedation:  Mild sedation. At this level, you may feel awake and relaxed. You will be able to follow directions.  Moderate sedation. At this level, you will be sleepy. You may not remember the procedure.  Deep sedation. At this level, you will be asleep. You will not remember the procedure.  The more medicine you are given, the deeper your level of sedation will be. Depending on how you respond to the procedure, the anesthesia specialist may change your level of sedation or the type of anesthesia to fit your needs. An anesthesia specialist will monitor you closely during the procedure. Let your health care provider know about:  Any allergies you have.  All medicines you are taking, including vitamins, herbs, eye drops, creams, and over-the-counter medicines.  Any use of steroids (by mouth or as a cream).  Any problems you or family members have had with sedatives and anesthetic medicines.  Any blood disorders you have.  Any surgeries you have had.  Any medical conditions you have, such as sleep apnea.  Whether you are pregnant or may be pregnant.  Any use of cigarettes, alcohol, or street drugs. What are the risks? Generally, this is a safe procedure. However, problems may occur, including:  Getting too much medicine (oversedation).  Nausea.  Allergic reaction to medicines.  Trouble breathing. If this happens, a breathing tube may be used to help with breathing. It will be removed when you are awake and breathing on your own.  Heart trouble.  Lung trouble.  Before  the procedure Staying hydrated Follow instructions from your health care provider about hydration, which may include:  Up to 2 hours before the procedure - you may continue to drink clear liquids, such as water, clear fruit juice, black coffee, and plain tea.  Eating and drinking restrictions Follow instructions from your health care provider about eating and drinking, which  may include:  8 hours before the procedure - stop eating heavy meals or foods such as meat, fried foods, or fatty foods.  6 hours before the procedure - stop eating light meals or foods, such as toast or cereal.  6 hours before the procedure - stop drinking milk or drinks that contain milk.  2 hours before the procedure - stop drinking clear liquids.  Medicines Ask your health care provider about:  Changing or stopping your regular medicines. This is especially important if you are taking diabetes medicines or blood thinners.  Taking medicines such as aspirin and ibuprofen. These medicines can thin your blood. Do not take these medicines before your procedure if your health care provider instructs you not to.  Tests and exams  You will have a physical exam.  You may have blood tests done to show: ? How well your kidneys and liver are working. ? How well your blood can clot.  General instructions  Plan to have someone take you home from the hospital or clinic.  If you will be going home right after the procedure, plan to have someone with you for 24 hours.  What happens during the procedure?  Your blood pressure, heart rate, breathing, level of pain and overall condition will be monitored.  An IV tube will be inserted into one of your veins.  Your anesthesia specialist will give you medicines as needed to keep you comfortable during the procedure. This may mean changing the level of sedation.  The procedure will be performed. After the procedure  Your blood pressure, heart rate, breathing rate, and  blood oxygen level will be monitored until the medicines you were given have worn off.  Do not drive for 24 hours if you received a sedative.  You may: ? Feel sleepy, clumsy, or nauseous. ? Feel forgetful about what happened after the procedure. ? Have a sore throat if you had a breathing tube during the procedure. ? Vomit. This information is not intended to replace advice given to you by your health care provider. Make sure you discuss any questions you have with your health care provider. Document Released: 11/23/2004 Document Revised: 08/06/2015 Document Reviewed: 06/20/2015 Elsevier Interactive Patient Education  2018 [place], Care After These instructions provide you with information about caring for yourself after your procedure. Your health care provider may also give you more specific instructions. Your treatment has been planned according to current medical practices, but problems sometimes occur. Call your health care provider if you have any problems or questions after your procedure. What can I expect after the procedure? After your procedure, it is common to:  Feel sleepy for several hours.  Feel clumsy and have poor balance for several hours.  Feel forgetful about what happened after the procedure.  Have poor judgment for several hours.  Feel nauseous or vomit.  Have a sore throat if you had a breathing tube during the procedure.  Follow these instructions at home: For at least 24 hours after the procedure:   Do not: ? Participate in activities in which you could fall or become injured. ? Drive. ? Use heavy machinery. ? Drink alcohol. ? Take sleeping pills or medicines that cause drowsiness. ? Make important decisions or sign legal documents. ? Take care of children on your own.  Rest. Eating and drinking  Follow the diet that is recommended by your health care provider.  If you vomit, drink water, juice, or soup when you  can  drink without vomiting.  Make sure you have little or no nausea before eating solid foods. General instructions  Have a responsible adult stay with you until you are awake and alert.  Take over-the-counter and prescription medicines only as told by your health care provider.  If you smoke, do not smoke without supervision.  Keep all follow-up visits as told by your health care provider. This is important. Contact a health care provider if:  You keep feeling nauseous or you keep vomiting.  You feel light-headed.  You develop a rash.  You have a fever. Get help right away if:  You have trouble breathing. This information is not intended to replace advice given to you by your health care provider. Make sure you discuss any questions you have with your health care provider. Document Released: 06/20/2015 Document Revised: 10/20/2015 Document Reviewed: 06/20/2015 Elsevier Interactive Patient Education  [employer].

## 2017-06-14 ENCOUNTER — Encounter (HOSPITAL_COMMUNITY): Payer: Self-pay

## 2017-06-14 ENCOUNTER — Telehealth: Payer: Self-pay

## 2017-06-14 ENCOUNTER — Encounter (HOSPITAL_COMMUNITY)
Admission: RE | Admit: 2017-06-14 | Discharge: 2017-06-14 | Disposition: A | Discharge status: Home / Self Care | Payer: Self-pay | Source: Ambulatory Visit | Attending: Internal Medicine | Admitting: Internal Medicine

## 2017-06-14 NOTE — Telephone Encounter (Signed)
Endo scheduler called office. Pt was no show for pre-op appt today. Tried to call pt, no answer, no VM set-up.

## 2017-06-14 NOTE — Progress Notes (Signed)
Patient did not show for her PAT.  Attempted to contact, however was unsuccessful.

## 2017-06-18 ENCOUNTER — Ambulatory Visit: Payer: Self-pay | Admitting: Physician Assistant

## 2017-06-19 NOTE — Telephone Encounter (Signed)
Routing to EG as FYI.

## 2017-06-19 NOTE — Telephone Encounter (Signed)
Spoke to endo scheduler, pt hasn't had pre-op appt. Called pt to see if she was going to do TCS 06/21/17. She said she was going to need to reschedule. Offered to reschedule, phone call was then disconnected. Tried to call pt back, no answer, no VM set up. Informed endo scheduler.

## 2017-06-21 ENCOUNTER — Encounter (HOSPITAL_COMMUNITY): Admission: RE | Payer: Self-pay | Source: Ambulatory Visit

## 2017-06-21 ENCOUNTER — Ambulatory Visit (HOSPITAL_COMMUNITY): Admission: RE | Admit: 2017-06-21 | Payer: Self-pay | Source: Ambulatory Visit | Admitting: Internal Medicine

## 2017-06-21 SURGERY — COLONOSCOPY WITH PROPOFOL
Anesthesia: Monitor Anesthesia Care

## 2017-06-25 ENCOUNTER — Encounter: Payer: Self-pay | Admitting: Physician Assistant

## 2017-06-26 NOTE — Telephone Encounter (Signed)
Noted  

## 2017-08-27 ENCOUNTER — Ambulatory Visit: Payer: Self-pay | Admitting: Nurse Practitioner

## 2017-08-27 ENCOUNTER — Telehealth: Payer: Self-pay | Admitting: Nurse Practitioner

## 2017-08-27 ENCOUNTER — Encounter: Payer: Self-pay | Admitting: Internal Medicine

## 2017-08-27 NOTE — Telephone Encounter (Signed)
Noted  

## 2017-08-27 NOTE — Telephone Encounter (Signed)
PATIENT WAS A NO SHOW AND LETTER SENT  °
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
## 2018-11-25 ENCOUNTER — Emergency Department (HOSPITAL_COMMUNITY): Payer: Self-pay

## 2018-11-25 ENCOUNTER — Encounter (HOSPITAL_COMMUNITY): Payer: Self-pay | Admitting: Emergency Medicine

## 2018-11-25 ENCOUNTER — Emergency Department (HOSPITAL_COMMUNITY)
Admission: EM | Admit: 2018-11-25 | Discharge: 2018-11-25 | Disposition: A | Discharge status: Home / Self Care | Payer: Self-pay | Attending: Emergency Medicine | Admitting: Emergency Medicine

## 2018-11-25 ENCOUNTER — Other Ambulatory Visit: Payer: Self-pay

## 2018-11-25 DIAGNOSIS — I10 Essential (primary) hypertension: Secondary | ICD-10-CM | POA: Insufficient documentation

## 2018-11-25 DIAGNOSIS — M25552 Pain in left hip: Secondary | ICD-10-CM

## 2018-11-25 DIAGNOSIS — M545 Low back pain, unspecified: Secondary | ICD-10-CM

## 2018-11-25 DIAGNOSIS — Z79899 Other long term (current) drug therapy: Secondary | ICD-10-CM | POA: Insufficient documentation

## 2018-11-25 DIAGNOSIS — Z87891 Personal history of nicotine dependence: Secondary | ICD-10-CM | POA: Insufficient documentation

## 2018-11-25 LAB — URINALYSIS, ROUTINE W REFLEX MICROSCOPIC
Bilirubin Urine: NEGATIVE
Glucose, UA: NEGATIVE mg/dL
Ketones, ur: NEGATIVE mg/dL
Leukocytes,Ua: NEGATIVE
Nitrite: NEGATIVE
Protein, ur: NEGATIVE mg/dL
RBC / HPF: >50 RBC/hpf — ABNORMAL HIGH (ref 0–5)
Specific Gravity, Urine: 1.024 (ref 1.005–1.030)
pH: 6 (ref 5.0–8.0)

## 2018-11-25 LAB — POC URINE PREG, ED: Preg Test, Ur: NEGATIVE

## 2018-11-25 MED ORDER — LIDOCAINE 5 % EX PTCH: 1.0000 | MEDICATED_PATCH | CUTANEOUS | 0 refills | Status: DC

## 2018-11-25 MED ORDER — HYDROCODONE-ACETAMINOPHEN 5-325 MG PO TABS: 1.0000 | ORAL_TABLET | ORAL | 0 refills | Status: DC | PRN

## 2018-11-25 MED ORDER — HYDROCODONE-ACETAMINOPHEN 5-325 MG PO TABS
1.0000 | ORAL_TABLET | Freq: Once | ORAL | Status: AC
  Administered 2018-11-25: 1 via ORAL
  Filled 2018-11-25: qty 1

## 2018-11-25 NOTE — ED Triage Notes (Signed)
Pain to LT hip that radiates to LT buttocks since 08/04.  Has seen pcp for same problem

## 2018-11-25 NOTE — Discharge Instructions (Signed)
Take them medications as prescribed. Do not drink alcohol or take medications as prescribed. Follow up with Orthopedics as prescribed.
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
## 2018-12-03 ENCOUNTER — Emergency Department (HOSPITAL_COMMUNITY)
Admission: EM | Admit: 2018-12-03 | Discharge: 2018-12-03 | Disposition: A | Discharge status: Home / Self Care | Payer: PRIVATE HEALTH INSURANCE | Attending: Emergency Medicine | Admitting: Emergency Medicine

## 2018-12-03 ENCOUNTER — Other Ambulatory Visit: Payer: Self-pay

## 2018-12-03 ENCOUNTER — Other Ambulatory Visit: Payer: Self-pay | Admitting: Physician Assistant

## 2018-12-03 ENCOUNTER — Encounter (HOSPITAL_COMMUNITY): Payer: Self-pay

## 2018-12-03 DIAGNOSIS — I10 Essential (primary) hypertension: Secondary | ICD-10-CM | POA: Insufficient documentation

## 2018-12-03 DIAGNOSIS — R300 Dysuria: Secondary | ICD-10-CM | POA: Diagnosis not present

## 2018-12-03 DIAGNOSIS — Z87891 Personal history of nicotine dependence: Secondary | ICD-10-CM | POA: Diagnosis not present

## 2018-12-03 DIAGNOSIS — Z79899 Other long term (current) drug therapy: Secondary | ICD-10-CM | POA: Diagnosis not present

## 2018-12-03 DIAGNOSIS — R1032 Left lower quadrant pain: Secondary | ICD-10-CM | POA: Diagnosis present

## 2018-12-03 LAB — URINALYSIS, ROUTINE W REFLEX MICROSCOPIC
Bacteria, UA: NONE SEEN
Bilirubin Urine: NEGATIVE
Glucose, UA: NEGATIVE mg/dL
Ketones, ur: NEGATIVE mg/dL
Leukocytes,Ua: NEGATIVE
Nitrite: NEGATIVE
Protein, ur: NEGATIVE mg/dL
Specific Gravity, Urine: 1.017 (ref 1.005–1.030)
pH: 6 (ref 5.0–8.0)

## 2018-12-03 LAB — WET PREP, GENITAL
Clue Cells Wet Prep HPF POC: NONE SEEN
Sperm: NONE SEEN
Trich, Wet Prep: NONE SEEN
Yeast Wet Prep HPF POC: NONE SEEN

## 2018-12-03 LAB — PREGNANCY, URINE: Preg Test, Ur: NEGATIVE

## 2018-12-03 MED ORDER — IBUPROFEN 400 MG PO TABS
400.0000 mg | ORAL_TABLET | Freq: Once | ORAL | Status: AC
  Administered 2018-12-03: 13:00:00 400 mg via ORAL
  Filled 2018-12-03: qty 1

## 2018-12-03 MED ORDER — ACETAMINOPHEN 500 MG PO TABS
1000.0000 mg | ORAL_TABLET | Freq: Once | ORAL | Status: AC
  Administered 2018-12-03: 1000 mg via ORAL
  Filled 2018-12-03: qty 2

## 2018-12-03 NOTE — ED Triage Notes (Signed)
Pt c/o abd pain and frequent urination x 1 week.  Reports itching and feeling "sore."  Pt reports new vaginal discharge.  Also c/o left hip pain.  LMP was last week.
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
## 2018-12-03 NOTE — Discharge Instructions (Signed)
Your blood pressure is elevated, but otherwise your vital signs within normal limits.  Your urine test is much improved from 8 days ago.  There is no signs of major infection at this time.  Your wet prep is negative for yeast or bacterial vaginosis or trichomonas.  Please see your physicians at the [facility] for continued work-up of your discomfort.  Azo tablets may be helpful.  Please return to the emergency department if any high fever, nausea/vomiting, increasing pain, changes in your general condition, problems or concerns.

## 2018-12-04 LAB — CERVICOVAGINAL ANCILLARY ONLY
Chlamydia: NEGATIVE
Neisseria Gonorrhea: NEGATIVE

## 2019-03-21 ENCOUNTER — Ambulatory Visit (HOSPITAL_COMMUNITY): Payer: PRIVATE HEALTH INSURANCE | Attending: Family Medicine | Admitting: Physical Therapy

## 2019-03-21 ENCOUNTER — Encounter (HOSPITAL_COMMUNITY): Payer: Self-pay

## 2019-03-25 ENCOUNTER — Telehealth (HOSPITAL_COMMUNITY): Payer: Self-pay | Admitting: Physical Therapy

## 2019-03-25 NOTE — Telephone Encounter (Signed)
S/w deena from the pt's insurance plan and was told the pt only has a limited indeminity plan and will not cover pt services. S/w the pt and she is aware and just wanted [place] to cancel the appt.

## 2019-03-26 ENCOUNTER — Ambulatory Visit (HOSPITAL_COMMUNITY): Payer: PRIVATE HEALTH INSURANCE | Admitting: Physical Therapy

## 2019-05-10 ENCOUNTER — Emergency Department (HOSPITAL_COMMUNITY)
Admission: EM | Admit: 2019-05-10 | Discharge: 2019-05-10 | Disposition: A | Discharge status: Home / Self Care | Payer: PRIVATE HEALTH INSURANCE | Attending: Emergency Medicine | Admitting: Emergency Medicine

## 2019-05-10 ENCOUNTER — Encounter (HOSPITAL_COMMUNITY): Payer: Self-pay

## 2019-05-10 ENCOUNTER — Other Ambulatory Visit: Payer: Self-pay

## 2019-05-10 DIAGNOSIS — Z87891 Personal history of nicotine dependence: Secondary | ICD-10-CM | POA: Diagnosis not present

## 2019-05-10 DIAGNOSIS — Z79899 Other long term (current) drug therapy: Secondary | ICD-10-CM | POA: Diagnosis not present

## 2019-05-10 DIAGNOSIS — I10 Essential (primary) hypertension: Secondary | ICD-10-CM | POA: Diagnosis not present

## 2019-05-10 DIAGNOSIS — Z91040 Latex allergy status: Secondary | ICD-10-CM | POA: Insufficient documentation

## 2019-05-10 DIAGNOSIS — M25511 Pain in right shoulder: Secondary | ICD-10-CM | POA: Diagnosis present

## 2019-05-10 MED ORDER — PREDNISONE 10 MG PO TABS: ORAL_TABLET | ORAL | 0 refills | Status: DC

## 2019-05-10 NOTE — ED Triage Notes (Signed)
Pt c/o r shoulder pain x 3 weeks.  Denies injury.  Reports pain worse with movement.  Reports when she raises her r arm, r arm feels weak.

## 2019-05-10 NOTE — Discharge Instructions (Signed)
See Dr. Aline Brochure for recheck
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
## 2019-05-23 ENCOUNTER — Other Ambulatory Visit: Payer: Self-pay | Admitting: Neurosurgery

## 2019-06-16 NOTE — Pre-Procedure Instructions (Signed)
[place], [place] - [id number] [place] [address] Phone: [phone] Fax: [phone]  [facility] [address], [place] - [id number] [place] [address] Phone: [phone] Fax: [phone]      Your procedure is scheduled on Friday, April 9th.  Report to [facility] Main Entrance "A" at 6:00 A.M., and check in at the Admitting office.  Call this number if you have problems the morning of surgery:  [phone]  Call [phone] if you have any questions prior to your surgery date Monday-Friday 8am-4pm    Remember:  Do not eat or drink after midnight the night before your surgery    Take these medicines the morning of surgery with A SIP OF WATER  amLODipine (NORVASC) ULTRAM -as needed cyclobenzaprine (FLEXERIL)-as needed  As of today, STOP taking any Aspirin (unless otherwise instructed by your surgeon) and Aspirin containing products, Aleve, Naproxen, Ibuprofen, Motrin, Advil, Goody's, BC's, all herbal medications, fish oil, and all vitamins.                      Do not wear jewelry, make up, or nail polish            Do not wear lotions, powders, perfumes, or deodorant.            Do not shave 48 hours prior to surgery.  .            Do not bring valuables to the hospital.            [facility] is not responsible for any belongings or valuables.  Do NOT Smoke (Tobacco/Vapping) or drink Alcohol 24 hours prior to your procedure If you use a CPAP at night, you may bring all equipment for your overnight stay.   Contacts, glasses, dentures or bridgework may not be worn into surgery.      For patients admitted to the hospital, discharge time will be determined by your treatment team.   Patients discharged the day of surgery will not be allowed to drive home, and someone needs to stay with them for 24 hours.    Special instructions:   [facility]- Preparing For  Surgery  Before surgery, you can play an important role. Because skin is not sterile, your skin needs to be as free of germs as possible. You can reduce the number of germs on your skin by washing with CHG (chlorahexidine gluconate) Soap before surgery.  CHG is an antiseptic cleaner which kills germs and bonds with the skin to continue killing germs even after washing.    Oral Hygiene is also important to reduce your risk of infection.  Remember - BRUSH YOUR TEETH THE MORNING OF SURGERY WITH YOUR REGULAR TOOTHPASTE  Please do not use if you have an allergy to CHG or antibacterial soaps. If your skin becomes reddened/irritated stop using the CHG.  Do not shave (including legs and underarms) for at least 48 hours prior to first CHG shower. It is OK to shave your face.  Please follow these instructions carefully.   1. Shower the NIGHT BEFORE SURGERY and the MORNING OF SURGERY with CHG Soap.   2. If you chose to wash your hair, wash your hair first as usual with your normal shampoo.  3. After you shampoo, rinse your hair and body thoroughly to remove the shampoo.  4. Use CHG as you would any other liquid  soap. You can apply CHG directly to the skin and wash gently with a scrungie or a clean washcloth.   5. Apply the CHG Soap to your body ONLY FROM THE NECK DOWN.  Do not use on open wounds or open sores. Avoid contact with your eyes, ears, mouth and genitals (private parts). Wash Face and genitals (private parts)  with your normal soap.   6. Wash thoroughly, paying special attention to the area where your surgery will be performed.  7. Thoroughly rinse your body with warm water from the neck down.  8. DO NOT shower/wash with your normal soap after using and rinsing off the CHG Soap.  9. Pat yourself dry with a CLEAN TOWEL.  10. Wear CLEAN PAJAMAS to bed the night before surgery, wear comfortable clothes the morning of surgery  11. Place CLEAN SHEETS on your bed the night of your first  shower and DO NOT SLEEP WITH PETS.   Day of Surgery:   Do not apply any deodorants/lotions.  Please wear clean clothes to the hospital/surgery center.   Remember to brush your teeth WITH YOUR REGULAR TOOTHPASTE.   Please read over the following fact sheets that you were given.

## 2019-06-17 ENCOUNTER — Other Ambulatory Visit: Payer: Self-pay

## 2019-06-17 ENCOUNTER — Other Ambulatory Visit (HOSPITAL_COMMUNITY)
Admission: RE | Admit: 2019-06-17 | Discharge: 2019-06-17 | Disposition: A | Discharge status: Home / Self Care | Payer: PRIVATE HEALTH INSURANCE | Source: Ambulatory Visit | Attending: Neurosurgery | Admitting: Neurosurgery

## 2019-06-17 ENCOUNTER — Encounter (HOSPITAL_COMMUNITY): Payer: Self-pay

## 2019-06-17 ENCOUNTER — Encounter (HOSPITAL_COMMUNITY)
Admission: RE | Admit: 2019-06-17 | Discharge: 2019-06-17 | Disposition: A | Discharge status: Home / Self Care | Payer: PRIVATE HEALTH INSURANCE | Source: Ambulatory Visit | Attending: Neurosurgery | Admitting: Neurosurgery

## 2019-06-17 DIAGNOSIS — Z01810 Encounter for preprocedural cardiovascular examination: Secondary | ICD-10-CM | POA: Insufficient documentation

## 2019-06-17 DIAGNOSIS — Z20822 Contact with and (suspected) exposure to covid-19: Secondary | ICD-10-CM | POA: Insufficient documentation

## 2019-06-17 DIAGNOSIS — I4510 Unspecified right bundle-branch block: Secondary | ICD-10-CM | POA: Insufficient documentation

## 2019-06-17 DIAGNOSIS — Z01812 Encounter for preprocedural laboratory examination: Secondary | ICD-10-CM | POA: Diagnosis not present

## 2019-06-17 DIAGNOSIS — I10 Essential (primary) hypertension: Secondary | ICD-10-CM | POA: Diagnosis present

## 2019-06-17 HISTORY — DX: Unspecified osteoarthritis, unspecified site: M19.90

## 2019-06-17 LAB — CBC WITH DIFFERENTIAL/PLATELET
Abs Immature Granulocytes: 0.01 K/uL (ref 0.00–0.07)
Basophils Absolute: 0 K/uL (ref 0.0–0.1)
Basophils Relative: 1 %
Eosinophils Absolute: 0.1 K/uL (ref 0.0–0.5)
Eosinophils Relative: 1 %
HCT: 43.2 % (ref 36.0–46.0)
Hemoglobin: 13.9 g/dL (ref 12.0–15.0)
Immature Granulocytes: 0 %
Lymphocytes Relative: 28 %
Lymphs Abs: 1.6 K/uL (ref 0.7–4.0)
MCH: 29.8 pg (ref 26.0–34.0)
MCHC: 32.2 g/dL (ref 30.0–36.0)
MCV: 92.7 fL (ref 80.0–100.0)
Monocytes Absolute: 0.4 K/uL (ref 0.1–1.0)
Monocytes Relative: 7 %
Neutro Abs: 3.6 K/uL (ref 1.7–7.7)
Neutrophils Relative %: 63 %
Platelets: 262 K/uL (ref 150–400)
RBC: 4.66 MIL/uL (ref 3.87–5.11)
RDW: 12.4 % (ref 11.5–15.5)
WBC: 5.7 K/uL (ref 4.0–10.5)
nRBC: 0 % (ref 0.0–0.2)

## 2019-06-17 LAB — ABO/RH: ABO/RH(D): A POS

## 2019-06-17 LAB — BASIC METABOLIC PANEL WITH GFR
Anion gap: 7 (ref 5–15)
BUN: 10 mg/dL (ref 6–20)
CO2: 27 mmol/L (ref 22–32)
Calcium: 9.3 mg/dL (ref 8.9–10.3)
Chloride: 104 mmol/L (ref 98–111)
Creatinine, Ser: 0.71 mg/dL (ref 0.44–1.00)
GFR calc Af Amer: >60 mL/min
GFR calc non Af Amer: >60 mL/min
Glucose, Bld: 100 mg/dL — ABNORMAL HIGH (ref 70–99)
Potassium: 4.3 mmol/L (ref 3.5–5.1)
Sodium: 138 mmol/L (ref 135–145)

## 2019-06-17 LAB — SURGICAL PCR SCREEN
MRSA, PCR: NEGATIVE
Staphylococcus aureus: NEGATIVE

## 2019-06-17 LAB — SARS CORONAVIRUS 2 (TAT 6-24 HRS): SARS Coronavirus 2: NEGATIVE

## 2019-06-17 LAB — TYPE AND SCREEN
ABO/RH(D): A POS
Antibody Screen: NEGATIVE

## 2019-06-17 NOTE — Progress Notes (Addendum)
PCP - [facility] Cardiologist - Denies  Chest x-ray - n/a EKG -06/17/19  Stress Test - Denies ECHO - n/a Cardiac Cath - Denies  Sleep Study - Denies  COVID TEST- 06/17/19   Anesthesia review: Yes Abnormal EKG RBBB  Patient denies shortness of breath, fever, cough and chest pain at PAT appointment   All instructions explained to the patient, with a verbal understanding of the material. Patient agrees to go over the instructions while at home for a better understanding. Patient also instructed to self quarantine after being tested for COVID-19. The opportunity to ask questions was provided.
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
## 2019-06-19 MED ORDER — DEXTROSE 5 % IV SOLN
3.0000 g | INTRAVENOUS | Status: AC
  Administered 2019-06-20: 3 g via INTRAVENOUS
  Filled 2019-06-19 (×2): qty 3000

## 2019-06-20 ENCOUNTER — Other Ambulatory Visit: Payer: Self-pay

## 2019-06-20 ENCOUNTER — Encounter (HOSPITAL_COMMUNITY): Payer: Self-pay | Admitting: Neurosurgery

## 2019-06-20 ENCOUNTER — Inpatient Hospital Stay (HOSPITAL_COMMUNITY)
Admission: RE | Admit: 2019-06-20 | Discharge: 2019-06-21 | DRG: 455 | Disposition: A | Discharge status: Home / Self Care | Payer: No Typology Code available for payment source | Attending: Neurosurgery | Admitting: Neurosurgery

## 2019-06-20 ENCOUNTER — Inpatient Hospital Stay (HOSPITAL_COMMUNITY): Payer: No Typology Code available for payment source

## 2019-06-20 ENCOUNTER — Inpatient Hospital Stay (HOSPITAL_COMMUNITY): Payer: No Typology Code available for payment source | Admitting: Anesthesiology

## 2019-06-20 ENCOUNTER — Inpatient Hospital Stay (HOSPITAL_COMMUNITY): Payer: No Typology Code available for payment source | Admitting: Vascular Surgery

## 2019-06-20 ENCOUNTER — Inpatient Hospital Stay (HOSPITAL_COMMUNITY)
Admission: RE | Disposition: A | Discharge status: Home / Self Care | Payer: Self-pay | Source: Home / Self Care | Attending: Neurosurgery

## 2019-06-20 DIAGNOSIS — M1612 Unilateral primary osteoarthritis, left hip: Secondary | ICD-10-CM | POA: Diagnosis not present

## 2019-06-20 DIAGNOSIS — M479 Spondylosis, unspecified: Secondary | ICD-10-CM | POA: Diagnosis not present

## 2019-06-20 DIAGNOSIS — Y92410 Unspecified street and highway as the place of occurrence of the external cause: Secondary | ICD-10-CM | POA: Diagnosis not present

## 2019-06-20 DIAGNOSIS — M48061 Spinal stenosis, lumbar region without neurogenic claudication: Secondary | ICD-10-CM | POA: Diagnosis not present

## 2019-06-20 DIAGNOSIS — Y990 Civilian activity done for income or pay: Secondary | ICD-10-CM

## 2019-06-20 DIAGNOSIS — M4310 Spondylolisthesis, site unspecified: Secondary | ICD-10-CM | POA: Diagnosis present

## 2019-06-20 DIAGNOSIS — G629 Polyneuropathy, unspecified: Secondary | ICD-10-CM | POA: Diagnosis not present

## 2019-06-20 DIAGNOSIS — I10 Essential (primary) hypertension: Secondary | ICD-10-CM | POA: Diagnosis not present

## 2019-06-20 DIAGNOSIS — M5116 Intervertebral disc disorders with radiculopathy, lumbar region: Secondary | ICD-10-CM | POA: Diagnosis present

## 2019-06-20 DIAGNOSIS — Z79899 Other long term (current) drug therapy: Secondary | ICD-10-CM

## 2019-06-20 DIAGNOSIS — Z9109 Other allergy status, other than to drugs and biological substances: Secondary | ICD-10-CM | POA: Diagnosis not present

## 2019-06-20 DIAGNOSIS — Z87891 Personal history of nicotine dependence: Secondary | ICD-10-CM | POA: Diagnosis not present

## 2019-06-20 DIAGNOSIS — M4316 Spondylolisthesis, lumbar region: Secondary | ICD-10-CM | POA: Diagnosis not present

## 2019-06-20 DIAGNOSIS — Z91040 Latex allergy status: Secondary | ICD-10-CM | POA: Diagnosis not present

## 2019-06-20 DIAGNOSIS — M549 Dorsalgia, unspecified: Secondary | ICD-10-CM | POA: Diagnosis present

## 2019-06-20 DIAGNOSIS — Z91010 Allergy to peanuts: Secondary | ICD-10-CM | POA: Diagnosis not present

## 2019-06-20 DIAGNOSIS — K219 Gastro-esophageal reflux disease without esophagitis: Secondary | ICD-10-CM | POA: Diagnosis present

## 2019-06-20 DIAGNOSIS — Z91030 Bee allergy status: Secondary | ICD-10-CM

## 2019-06-20 DIAGNOSIS — Z419 Encounter for procedure for purposes other than remedying health state, unspecified: Secondary | ICD-10-CM

## 2019-06-20 DIAGNOSIS — Z8249 Family history of ischemic heart disease and other diseases of the circulatory system: Secondary | ICD-10-CM | POA: Diagnosis not present

## 2019-06-20 LAB — POCT PREGNANCY, URINE: Preg Test, Ur: NEGATIVE

## 2019-06-20 SURGERY — POSTERIOR LUMBAR FUSION 1 LEVEL
Anesthesia: General | Site: Spine Lumbar

## 2019-06-20 MED ORDER — BUPIVACAINE HCL (PF) 0.25 % IJ SOLN
INTRAMUSCULAR | Status: AC
  Filled 2019-06-20: qty 30

## 2019-06-20 MED ORDER — ONDANSETRON HCL 4 MG/2ML IJ SOLN: 4.0000 mg | Freq: Once | INTRAMUSCULAR | Status: DC | PRN

## 2019-06-20 MED ORDER — SODIUM CHLORIDE 0.9% FLUSH
3.0000 mL | Freq: Two times a day (BID) | INTRAVENOUS | Status: DC
  Administered 2019-06-20: 21:00:00 3 mL via INTRAVENOUS

## 2019-06-20 MED ORDER — FENTANYL CITRATE (PF) 250 MCG/5ML IJ SOLN
INTRAMUSCULAR | Status: AC
  Filled 2019-06-20: qty 5

## 2019-06-20 MED ORDER — PROPOFOL 10 MG/ML IV BOLUS
INTRAVENOUS | Status: AC
  Filled 2019-06-20: qty 20

## 2019-06-20 MED ORDER — OXYCODONE HCL 5 MG PO TABS
ORAL_TABLET | ORAL | Status: AC
  Filled 2019-06-20: qty 2

## 2019-06-20 MED ORDER — CHLORHEXIDINE GLUCONATE CLOTH 2 % EX PADS: 6.0000 | MEDICATED_PAD | Freq: Once | CUTANEOUS | Status: DC

## 2019-06-20 MED ORDER — LACTATED RINGERS IV SOLN: INTRAVENOUS | Status: DC

## 2019-06-20 MED ORDER — PHENYLEPHRINE HCL-NACL 10-0.9 MG/250ML-% IV SOLN
INTRAVENOUS | Status: DC | PRN
  Administered 2019-06-20: 50 ug/min via INTRAVENOUS

## 2019-06-20 MED ORDER — ONDANSETRON HCL 4 MG/2ML IJ SOLN: 4.0000 mg | Freq: Four times a day (QID) | INTRAMUSCULAR | Status: DC | PRN

## 2019-06-20 MED ORDER — OXYCODONE HCL 5 MG PO TABS
10.0000 mg | ORAL_TABLET | ORAL | Status: DC | PRN
  Administered 2019-06-20 – 2019-06-21 (×5): 10 mg via ORAL
  Filled 2019-06-20 (×4): qty 2

## 2019-06-20 MED ORDER — ROCURONIUM BROMIDE 50 MG/5ML IV SOSY
PREFILLED_SYRINGE | INTRAVENOUS | Status: DC | PRN
  Administered 2019-06-20: 30 mg via INTRAVENOUS
  Administered 2019-06-20: 100 mg via INTRAVENOUS

## 2019-06-20 MED ORDER — PHENOL 1.4 % MT LIQD: 1.0000 | OROMUCOSAL | Status: DC | PRN

## 2019-06-20 MED ORDER — PROPOFOL 10 MG/ML IV BOLUS
INTRAVENOUS | Status: DC | PRN
  Administered 2019-06-20: 100 mg via INTRAVENOUS

## 2019-06-20 MED ORDER — DEXAMETHASONE SODIUM PHOSPHATE 10 MG/ML IJ SOLN
10.0000 mg | Freq: Once | INTRAMUSCULAR | Status: AC
  Administered 2019-06-20: 10 mg via INTRAVENOUS

## 2019-06-20 MED ORDER — ALBUMIN HUMAN 5 % IV SOLN: INTRAVENOUS | Status: DC | PRN

## 2019-06-20 MED ORDER — ROCURONIUM BROMIDE 10 MG/ML (PF) SYRINGE
PREFILLED_SYRINGE | INTRAVENOUS | Status: AC
  Filled 2019-06-20: qty 10

## 2019-06-20 MED ORDER — HYDROMORPHONE HCL 1 MG/ML IJ SOLN
INTRAMUSCULAR | Status: AC
  Filled 2019-06-20: qty 1

## 2019-06-20 MED ORDER — ACETAMINOPHEN 325 MG PO TABS
650.0000 mg | ORAL_TABLET | ORAL | Status: DC | PRN
  Administered 2019-06-21 (×2): 650 mg via ORAL
  Filled 2019-06-20 (×2): qty 2

## 2019-06-20 MED ORDER — ONDANSETRON HCL 4 MG PO TABS: 4.0000 mg | ORAL_TABLET | Freq: Four times a day (QID) | ORAL | Status: DC | PRN

## 2019-06-20 MED ORDER — DIAZEPAM 5 MG PO TABS
5.0000 mg | ORAL_TABLET | Freq: Four times a day (QID) | ORAL | Status: DC | PRN
  Administered 2019-06-20 – 2019-06-21 (×3): 5 mg via ORAL
  Filled 2019-06-20 (×3): qty 1

## 2019-06-20 MED ORDER — MIDAZOLAM HCL 5 MG/5ML IJ SOLN
INTRAMUSCULAR | Status: DC | PRN
  Administered 2019-06-20: 2 mg via INTRAVENOUS

## 2019-06-20 MED ORDER — ONDANSETRON HCL 4 MG/2ML IJ SOLN
INTRAMUSCULAR | Status: DC | PRN
  Administered 2019-06-20: 4 mg via INTRAVENOUS

## 2019-06-20 MED ORDER — DEXAMETHASONE SODIUM PHOSPHATE 10 MG/ML IJ SOLN
INTRAMUSCULAR | Status: AC
  Filled 2019-06-20: qty 1

## 2019-06-20 MED ORDER — FLEET ENEMA 7-19 GM/118ML RE ENEM: 1.0000 | ENEMA | Freq: Once | RECTAL | Status: DC | PRN

## 2019-06-20 MED ORDER — PHENYLEPHRINE HCL (PRESSORS) 10 MG/ML IV SOLN
INTRAVENOUS | Status: DC | PRN
  Administered 2019-06-20 (×2): 80 ug via INTRAVENOUS

## 2019-06-20 MED ORDER — HYDROMORPHONE HCL 1 MG/ML IJ SOLN: 1.0000 mg | INTRAMUSCULAR | Status: DC | PRN

## 2019-06-20 MED ORDER — LIDOCAINE 2% (20 MG/ML) 5 ML SYRINGE
INTRAMUSCULAR | Status: AC
  Filled 2019-06-20: qty 5

## 2019-06-20 MED ORDER — ONDANSETRON HCL 4 MG/2ML IJ SOLN
INTRAMUSCULAR | Status: AC
  Filled 2019-06-20: qty 2

## 2019-06-20 MED ORDER — MEPERIDINE HCL 25 MG/ML IJ SOLN: 6.2500 mg | INTRAMUSCULAR | Status: DC | PRN

## 2019-06-20 MED ORDER — CEFAZOLIN SODIUM-DEXTROSE 1-4 GM/50ML-% IV SOLN
1.0000 g | Freq: Three times a day (TID) | INTRAVENOUS | Status: AC
  Administered 2019-06-20 – 2019-06-21 (×2): 1 g via INTRAVENOUS
  Filled 2019-06-20 (×2): qty 50

## 2019-06-20 MED ORDER — ACETAMINOPHEN 650 MG RE SUPP: 650.0000 mg | RECTAL | Status: DC | PRN

## 2019-06-20 MED ORDER — SODIUM CHLORIDE 0.9% FLUSH: 3.0000 mL | INTRAVENOUS | Status: DC | PRN

## 2019-06-20 MED ORDER — 0.9 % SODIUM CHLORIDE (POUR BTL) OPTIME
TOPICAL | Status: DC | PRN
  Administered 2019-06-20: 1000 mL

## 2019-06-20 MED ORDER — MENTHOL 3 MG MT LOZG: 1.0000 | LOZENGE | OROMUCOSAL | Status: DC | PRN

## 2019-06-20 MED ORDER — SODIUM CHLORIDE 0.9 % IV SOLN: 250.0000 mL | INTRAVENOUS | Status: DC

## 2019-06-20 MED ORDER — THROMBIN 20000 UNITS EX SOLR
CUTANEOUS | Status: AC
  Filled 2019-06-20: qty 20000

## 2019-06-20 MED ORDER — VANCOMYCIN HCL 1000 MG IV SOLR
INTRAVENOUS | Status: AC
  Filled 2019-06-20: qty 1000

## 2019-06-20 MED ORDER — THROMBIN 20000 UNITS EX SOLR: CUTANEOUS | Status: DC | PRN

## 2019-06-20 MED ORDER — LIDOCAINE 2% (20 MG/ML) 5 ML SYRINGE
INTRAMUSCULAR | Status: DC | PRN
  Administered 2019-06-20: 100 mg via INTRAVENOUS

## 2019-06-20 MED ORDER — POLYETHYLENE GLYCOL 3350 17 G PO PACK: 17.0000 g | PACK | Freq: Every day | ORAL | Status: DC | PRN

## 2019-06-20 MED ORDER — MIDAZOLAM HCL 2 MG/2ML IJ SOLN
INTRAMUSCULAR | Status: AC
  Filled 2019-06-20: qty 2

## 2019-06-20 MED ORDER — SODIUM CHLORIDE 0.9 % IV SOLN: INTRAVENOUS | Status: DC | PRN

## 2019-06-20 MED ORDER — BUPIVACAINE HCL (PF) 0.25 % IJ SOLN
INTRAMUSCULAR | Status: DC | PRN
  Administered 2019-06-20: 30 mL

## 2019-06-20 MED ORDER — FENTANYL CITRATE (PF) 100 MCG/2ML IJ SOLN
INTRAMUSCULAR | Status: DC | PRN
  Administered 2019-06-20: 150 ug via INTRAVENOUS
  Administered 2019-06-20 (×2): 100 ug via INTRAVENOUS

## 2019-06-20 MED ORDER — HYDROCODONE-ACETAMINOPHEN 10-325 MG PO TABS
1.0000 | ORAL_TABLET | ORAL | Status: DC | PRN
  Administered 2019-06-20: 1 via ORAL
  Filled 2019-06-20: qty 1

## 2019-06-20 MED ORDER — VANCOMYCIN HCL 1000 MG IV SOLR
INTRAVENOUS | Status: DC | PRN
  Administered 2019-06-20: 1000 mg via TOPICAL

## 2019-06-20 MED ORDER — BISACODYL 10 MG RE SUPP: 10.0000 mg | Freq: Every day | RECTAL | Status: DC | PRN

## 2019-06-20 MED ORDER — AMLODIPINE BESYLATE 5 MG PO TABS
5.0000 mg | ORAL_TABLET | Freq: Every day | ORAL | Status: DC
  Administered 2019-06-21: 10:00:00 5 mg via ORAL

## 2019-06-20 MED ORDER — HYDROMORPHONE HCL 1 MG/ML IJ SOLN
0.2500 mg | INTRAMUSCULAR | Status: DC | PRN
  Administered 2019-06-20 (×4): 0.5 mg via INTRAVENOUS

## 2019-06-20 MED ORDER — PHENYLEPHRINE 40 MCG/ML (10ML) SYRINGE FOR IV PUSH (FOR BLOOD PRESSURE SUPPORT)
PREFILLED_SYRINGE | INTRAVENOUS | Status: AC
  Filled 2019-06-20: qty 10

## 2019-06-20 MED ORDER — LACTATED RINGERS IV SOLN: INTRAVENOUS | Status: DC | PRN

## 2019-06-20 SURGICAL SUPPLY — 60 items
ADH SKN CLS APL DERMABOND .7 (GAUZE/BANDAGES/DRESSINGS) ×1
APL SKNCLS STERI-STRIP NONHPOA (GAUZE/BANDAGES/DRESSINGS) ×1
BAG DECANTER FOR FLEXI CONT (MISCELLANEOUS) ×3 IMPLANT
BENZOIN TINCTURE PRP APPL 2/3 (GAUZE/BANDAGES/DRESSINGS) ×3 IMPLANT
BUR MATCHSTICK NEURO 3.0 LAGG (BURR) ×3 IMPLANT
CANISTER SUCT 3000ML PPV (MISCELLANEOUS) ×3 IMPLANT
CAP LCK SPNE (Orthopedic Implant) ×4 IMPLANT
CAP LOCK SPINE RADIUS (Orthopedic Implant) IMPLANT
CAP LOCKING (Orthopedic Implant) ×12 IMPLANT
CARTRIDGE OIL MAESTRO DRILL (MISCELLANEOUS) ×1 IMPLANT
CLOSURE WOUND 1/2 X4 (GAUZE/BANDAGES/DRESSINGS) ×1
CNTNR URN SCR LID CUP LEK RST (MISCELLANEOUS) ×1 IMPLANT
CONT SPEC 4OZ STRL OR WHT (MISCELLANEOUS) ×3
COVER BACK TABLE 60X90IN (DRAPES) ×3 IMPLANT
DERMABOND ADVANCED (GAUZE/BANDAGES/DRESSINGS) ×2
DERMABOND ADVANCED .7 DNX12 (GAUZE/BANDAGES/DRESSINGS) ×1 IMPLANT
DEVICE INTERBODY ELEVATE 23X9 (Cage) ×4 IMPLANT
DIFFUSER DRILL AIR PNEUMATIC (MISCELLANEOUS) ×3 IMPLANT
DRAPE C-ARM 42X72 X-RAY (DRAPES) ×6 IMPLANT
DRAPE HALF SHEET 40X57 (DRAPES) ×2 IMPLANT
DRAPE LAPAROTOMY 100X72X124 (DRAPES) ×3 IMPLANT
DRAPE SURG 17X23 STRL (DRAPES) ×12 IMPLANT
DRSG OPSITE POSTOP 4X6 (GAUZE/BANDAGES/DRESSINGS) ×3 IMPLANT
DURAPREP 26ML APPLICATOR (WOUND CARE) ×3 IMPLANT
ELECT REM PT RETURN 9FT ADLT (ELECTROSURGICAL) ×3
ELECTRODE REM PT RTRN 9FT ADLT (ELECTROSURGICAL) ×1 IMPLANT
EVACUATOR 1/8 PVC DRAIN (DRAIN) IMPLANT
GAUZE 4X4 16PLY RFD (DISPOSABLE) IMPLANT
GAUZE SPONGE 4X4 12PLY STRL (GAUZE/BANDAGES/DRESSINGS) IMPLANT
GLOVE BIOGEL PI IND STRL 6.5 (GLOVE) ×1 IMPLANT
GLOVE BIOGEL PI INDICATOR 6.5 (GLOVE) ×2
GLOVE SS PI 9.0 STRL (GLOVE) ×4 IMPLANT
GLOVE SURG SS PI 6.5 STRL IVOR (GLOVE) ×4 IMPLANT
GLOVE SURG SS PI 7.5 STRL IVOR (GLOVE) ×6 IMPLANT
GOWN STRL REUS W/ TWL LRG LVL3 (GOWN DISPOSABLE) IMPLANT
GOWN STRL REUS W/ TWL XL LVL3 (GOWN DISPOSABLE) ×2 IMPLANT
GOWN STRL REUS W/TWL 2XL LVL3 (GOWN DISPOSABLE) IMPLANT
GOWN STRL REUS W/TWL LRG LVL3 (GOWN DISPOSABLE) ×6
GOWN STRL REUS W/TWL XL LVL3 (GOWN DISPOSABLE) ×12
KIT BASIN OR (CUSTOM PROCEDURE TRAY) ×3 IMPLANT
KIT TURNOVER KIT B (KITS) ×3 IMPLANT
MILL MEDIUM DISP (BLADE) ×3 IMPLANT
NEEDLE HYPO 22GX1.5 SAFETY (NEEDLE) ×3 IMPLANT
NS IRRIG 1000ML POUR BTL (IV SOLUTION) ×3 IMPLANT
OIL CARTRIDGE MAESTRO DRILL (MISCELLANEOUS) ×3
PACK LAMINECTOMY NEURO (CUSTOM PROCEDURE TRAY) ×3 IMPLANT
ROD RADIUS 40MM (Neuro Prosthesis/Implant) ×6 IMPLANT
ROD SPNL 40X5.5XNS TI RDS (Neuro Prosthesis/Implant) IMPLANT
SCREW 5.75X40M (Screw) ×4 IMPLANT
SCREW 5.75X45MM (Screw) ×4 IMPLANT
SPONGE SURGIFOAM ABS GEL 100 (HEMOSTASIS) ×3 IMPLANT
STRIP CLOSURE SKIN 1/2X4 (GAUZE/BANDAGES/DRESSINGS) ×3 IMPLANT
SUT VIC AB 0 CT1 18XCR BRD8 (SUTURE) ×2 IMPLANT
SUT VIC AB 0 CT1 8-18 (SUTURE) ×3
SUT VIC AB 2-0 CT1 18 (SUTURE) ×3 IMPLANT
SUT VIC AB 3-0 SH 8-18 (SUTURE) ×4 IMPLANT
TOWEL GREEN STERILE (TOWEL DISPOSABLE) ×3 IMPLANT
TOWEL GREEN STERILE FF (TOWEL DISPOSABLE) ×3 IMPLANT
TRAY FOLEY SLVR 16FR LF STAT (SET/KITS/TRAYS/PACK) ×2 IMPLANT
WATER STERILE IRR 1000ML POUR (IV SOLUTION) ×3 IMPLANT

## 2019-06-20 NOTE — Transfer of Care (Signed)
Immediate Anesthesia Transfer of Care Note  Patient: Diana Blevins  Procedure(s) Performed: LUMBAR FOUR-FIVE POSTERIOR LUMBAR INTERBODY FUSION (N/A Spine Lumbar)  Patient Location: PACU  Anesthesia Type:General  Level of Consciousness: awake, alert  and oriented  Airway & Oxygen Therapy: Patient Spontanous Breathing and Patient connected to nasal cannula oxygen  Post-op Assessment: Report given to RN, Post -op Vital signs reviewed and stable and Patient moving all extremities X 4  Post vital signs: Reviewed and stable  Last Vitals:  Vitals Value Taken Time  BP 123/78 06/20/19 1615  Temp    Pulse 81 06/20/19 1625  Resp 17 06/20/19 1625  SpO2 99 % 06/20/19 1625  Vitals shown include unvalidated device data.  Last Pain:  Vitals:   06/20/19 1059  TempSrc: Oral  PainSc:       Patients Stated Pain Goal: 5 ([id number])  Complications: No apparent anesthesia complications

## 2019-06-20 NOTE — Anesthesia Preprocedure Evaluation (Addendum)
Anesthesia Evaluation  Patient identified by MRN, date of birth, ID band Patient awake    Reviewed: Allergy & Precautions, NPO status , Patient's Chart, lab work & pertinent test results  Airway Mallampati: I  TM Distance: >3 FB Neck ROM: full    Dental  (+) Teeth Intact, Dental Advidsory Given   Pulmonary former smoker,    Pulmonary exam normal        Cardiovascular hypertension, On Medications Normal cardiovascular exam     Neuro/Psych    GI/Hepatic GERD  Medicated and Controlled,  Endo/Other    Renal/GU      Musculoskeletal   Abdominal   Peds  Hematology   Anesthesia Other Findings   Reproductive/Obstetrics                            Anesthesia Physical Anesthesia Plan  ASA: III  Anesthesia Plan: General   Post-op Pain Management:    Induction: Intravenous  PONV Risk Score and Plan: 3 and Ondansetron, Midazolam and Dexamethasone  Airway Management Planned: Oral ETT  Additional Equipment:   Intra-op Plan:   Post-operative Plan: Extubation in OR  Informed Consent: I have reviewed the patients History and Physical, chart, labs and discussed the procedure including the risks, benefits and alternatives for the proposed anesthesia with the patient or authorized representative who has indicated his/her understanding and acceptance.       Plan Discussed with: CRNA and Surgeon  Anesthesia Plan Comments:        Anesthesia Quick Evaluation
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
## 2019-06-20 NOTE — Anesthesia Procedure Notes (Signed)
Procedure Name: Intubation Date/Time: 06/20/2019 1:42 PM Performed by: Neldon Newport, CRNA Pre-anesthesia Checklist: Timeout performed, Patient being monitored, Suction available, Emergency Drugs available and Patient identified Patient Re-evaluated:Patient Re-evaluated prior to induction Oxygen Delivery Method: Circle system utilized Preoxygenation: Pre-oxygenation with 100% oxygen Induction Type: IV induction Ventilation: Mask ventilation without difficulty Laryngoscope Size: Mac and 3 Grade View: Grade I Tube type: Oral Tube size: 7.0 mm Number of attempts: 1 Placement Confirmation: ETT inserted through vocal cords under direct vision,  positive ETCO2 and breath sounds checked- equal and bilateral Secured at: 22 cm Tube secured with: Tape Dental Injury: Teeth and Oropharynx as per pre-operative assessment

## 2019-06-20 NOTE — Evaluation (Signed)
Physical Therapy Evaluation Patient Details Name: Diana Blevins MRN: [id number] DOB: January 25, 1966 Today's Date: 06/20/2019   History of Present Illness  pt is a 54 y/o female with h/o htn, neuropathy, admitted with significant radicular pain to her L lower extremeity, s/p L45 decompressive lami/foraminotomies, microdiscectomies and PLIF/PLA with autografting.  Clinical Impression  Pt admitted with/for L45 fusion/decompression surgery.  Pt is presently at S level.  Education initiated, but needs reinforced.  Pt currently limited functionally due to the problems listed below.  (see problems list.)  Pt will benefit from PT to maximize function and safety to be able to get home safely with available assist .     Follow Up Recommendations No PT follow up;Supervision/Assistance - 24 hour    Equipment Recommendations  Other (comment);3in1 (PT)(?RW)    Recommendations for Other Services       Precautions / Restrictions Precautions Precautions: Back Precaution Booklet Issued: No Required Braces or Orthoses: Spinal Brace Spinal Brace: Lumbar corset;Applied in sitting position Restrictions Weight Bearing Restrictions: No      Mobility  Bed Mobility               General bed mobility comments: OOB on arrival, discussed/demo'd bed mobility  Transfers Overall transfer level: Needs assistance   Transfers: Sit to/from Stand Sit to Stand: Supervision            Ambulation/Gait Ambulation/Gait assistance: Supervision Gait Distance (Feet): 300 Feet Assistive device: IV Pole;None Gait Pattern/deviations: Step-through pattern Gait velocity: slower Gait velocity interpretation: <1.8 ft/sec, indicate of risk for recurrent falls General Gait Details: generally steady, but guarded.  Stairs Stairs: Yes Stairs assistance: Min assist Stair Management: No rails;Step to pattern;Forwards Number of Stairs: 3 General stair comments: safe with rail or help  Wheelchair Mobility     Modified Rankin (Stroke Patients Only)       Balance Overall balance assessment: No apparent balance deficits (not formally assessed)                                           Pertinent Vitals/Pain Pain Assessment: Faces Faces Pain Scale: Hurts even more Pain Location: back incision  Pain Descriptors / Indicators: Pressure;Sore Pain Intervention(s): Monitored during session    Home Living Family/patient expects to be discharged to:: Private residence Living Arrangements: Other relatives Available Help at Discharge: Family;Available 24 hours/day Type of Home: Mobile home Home Access: Stairs to enter Entrance Stairs-Rails: None Entrance Stairs-Number of Steps: 2 Home Layout: One level Home Equipment: Shower seat      Prior Function Level of Independence: Independent               Hand Dominance        Extremity/Trunk Assessment   Upper Extremity Assessment Upper Extremity Assessment: Overall WFL for tasks assessed    Lower Extremity Assessment Lower Extremity Assessment: Overall WFL for tasks assessed(L weaker than R in general)       Communication   Communication: No difficulties  Cognition Arousal/Alertness: Awake/alert Behavior During Therapy: WFL for tasks assessed/performed Overall Cognitive Status: Within Functional Limits for tasks assessed                                        General Comments General comments (skin integrity, edema, etc.): pt instructed in back care/prec,  log roll, transitions, brace issues, lifting restrictions, progression of activity.    Exercises     Assessment/Plan    PT Assessment Patient needs continued PT services  PT Problem List Decreased strength;Decreased activity tolerance;Decreased knowledge of precautions;Decreased mobility;Pain       PT Treatment Interventions Gait training;DME instruction;Functional mobility training;Therapeutic activities;Patient/family education     PT Goals (Current goals can be found in the Care Plan section)  Acute Rehab PT Goals Patient Stated Goal: Home and able to do things I want PT Goal Formulation: With patient Time For Goal Achievement: 06/23/19 Potential to Achieve Goals: Good    Frequency Min 5X/week   Barriers to discharge        Co-evaluation               AM-PAC PT "6 Clicks" Mobility  Outcome Measure Help needed turning from your back to your side while in a flat bed without using bedrails?: None Help needed moving from lying on your back to sitting on the side of a flat bed without using bedrails?: None Help needed moving to and from a bed to a chair (including a wheelchair)?: None Help needed standing up from a chair using your arms (e.g., wheelchair or bedside chair)?: None Help needed to walk in hospital room?: None Help needed climbing 3-5 steps with a railing? : A Little 6 Click Score: 23    End of Session Equipment Utilized During Treatment: Back brace Activity Tolerance: Patient tolerated treatment well Patient left: in chair;with call bell/phone within reach Nurse Communication: Mobility status PT Visit Diagnosis: Other abnormalities of gait and mobility (R26.89);Pain Pain - part of body: (back)    Time: [id number] PT Time Calculation (min) (ACUTE ONLY): 23 min   Charges:   PT Evaluation $PT Eval Moderate Complexity: 1 Mod PT Treatments $Gait Training: 8-22 mins        06/20/2019  Ginger Carne., PT Acute Rehabilitation Services [phone]  (pager) [phone]  (office)  Tessie Fass Tomeshia Pizzi 06/20/2019, 7:02 PM

## 2019-06-20 NOTE — Brief Op Note (Signed)
06/20/2019  4:08 PM  PATIENT:  Diana Blevins  54 y.o. female  PRE-OPERATIVE DIAGNOSIS:  Spondylolisthesis  POST-OPERATIVE DIAGNOSIS:  Spondylolisthesis  PROCEDURE:  Procedure(s): LUMBAR FOUR-FIVE POSTERIOR LUMBAR INTERBODY FUSION (N/A)  SURGEON:  Surgeon(s) and Role:    * Earnie Larsson, MD - Primary  PHYSICIAN ASSISTANT:   ASSISTANTS: Ronnie Derby   ANESTHESIA:   general  EBL:  250 mL   BLOOD ADMINISTERED:none  DRAINS: none   LOCAL MEDICATIONS USED:  MARCAINE     SPECIMEN:  No Specimen  DISPOSITION OF SPECIMEN:  N/A  COUNTS:  YES  TOURNIQUET:  * No tourniquets in log *  DICTATION: .Dragon Dictation  PLAN OF CARE: Admit to inpatient   PATIENT DISPOSITION:  PACU - hemodynamically stable.   Delay start of Pharmacological VTE agent (>24hrs) due to surgical blood loss or risk of bleeding: yes

## 2019-06-20 NOTE — Anesthesia Postprocedure Evaluation (Signed)
Anesthesia Post Note  Patient: Diana Blevins  Procedure(s) Performed: LUMBAR FOUR-FIVE POSTERIOR LUMBAR INTERBODY FUSION (N/A Spine Lumbar)     Patient location during evaluation: PACU Anesthesia Type: General Level of consciousness: awake and alert Pain management: pain level controlled Vital Signs Assessment: post-procedure vital signs reviewed and stable Respiratory status: spontaneous breathing, nonlabored ventilation, respiratory function stable and patient connected to nasal cannula oxygen Cardiovascular status: blood pressure returned to baseline and stable Postop Assessment: no apparent nausea or vomiting Anesthetic complications: no    Last Vitals:  Vitals:   06/20/19 1630 06/20/19 1645  BP: 121/72 108/89  Pulse: 85 79  Resp: 12 14  Temp:    SpO2: 99% 99%    Last Pain:  Vitals:   06/20/19 1615  TempSrc:   PainSc: 10-Worst pain ever    LLE Motor Response: Purposeful movement;Responds to commands (06/20/19 1645) LLE Sensation: Full sensation (06/20/19 1645) RLE Motor Response: Purposeful movement;Responds to commands (06/20/19 1645) RLE Sensation: Full sensation (06/20/19 1645)      Arlin Savona DAVID

## 2019-06-21 MED ORDER — CYCLOBENZAPRINE HCL 10 MG PO TABS: 10.0000 mg | ORAL_TABLET | Freq: Three times a day (TID) | ORAL | Status: DC | PRN

## 2019-06-21 MED ORDER — CYCLOBENZAPRINE HCL 10 MG PO TABS: 10.0000 mg | ORAL_TABLET | Freq: Three times a day (TID) | ORAL | 0 refills | Status: DC | PRN

## 2019-06-21 MED ORDER — OXYCODONE HCL 10 MG PO TABS: 10.0000 mg | ORAL_TABLET | ORAL | 0 refills | Status: DC | PRN

## 2019-06-21 NOTE — Progress Notes (Signed)
Patient alert and oriented, mae's well, voiding adequate amount of urine, swallowing without difficulty, no c/o pain at time of discharge. Patient discharged home with family. Script and discharged instructions given to patient. Patient and family stated understanding of instructions given. Patient has an appointment with Dr. Pool  

## 2019-06-21 NOTE — Progress Notes (Signed)
Physical Therapy Treatment Patient Details Name: Diana Blevins MRN: [id number] DOB: 21-Jan-1966 Today's Date: 06/21/2019    History of Present Illness Pt is a 54 y/o female with PMH including but not limited to HTN and neuropathy, admitted with significant radicular pain to her L lower extremeity. Pt is now s/p L4-L5 decompressive lami/foraminotomies, microdiscectomies and PLIF/PLA with autografting.    PT Comments    Focus of session was on bed mobility and pt education. PT provided pt education re: back precautions, car transfers, donning/doffing LSO, wear schedule for LSO and a generalized walking program for pt to initiate upon d/c home. Plan is to d/c home today with family support.     Follow Up Recommendations  No PT follow up;Supervision/Assistance - 24 hour     Equipment Recommendations  3in1 (PT)    Recommendations for Other Services       Precautions / Restrictions Precautions Precautions: Back Precaution Comments: reviewed 3/3 back precautions with pt throughout Required Braces or Orthoses: Spinal Brace Spinal Brace: Lumbar corset;Applied in sitting position Restrictions Weight Bearing Restrictions: No    Mobility  Bed Mobility Overal bed mobility: Needs Assistance Bed Mobility: Sidelying to Sit   Sidelying to sit: Min guard       General bed mobility comments: cueing for log roll technique  Transfers Overall transfer level: Needs assistance Equipment used: None Transfers: Sit to/from Stand Sit to Stand: Supervision         General transfer comment: for safety  Ambulation/Gait Ambulation/Gait assistance: Supervision Gait Distance (Feet): 20 Feet Assistive device: None Gait Pattern/deviations: Step-through pattern     General Gait Details: no instability or LOB - focus of session was on bed mobility and pt education   Stairs             Wheelchair Mobility    Modified Rankin (Stroke Patients Only)       Balance                                            Cognition Arousal/Alertness: Awake/alert Behavior During Therapy: WFL for tasks assessed/performed Overall Cognitive Status: Within Functional Limits for tasks assessed                                        Exercises      General Comments        Pertinent Vitals/Pain Pain Assessment: Faces Faces Pain Scale: Hurts little more Pain Location: back incision  Pain Descriptors / Indicators: Pressure;Sore Pain Intervention(s): Monitored during session;Repositioned    Home Living                      Prior Function            PT Goals (current goals can now be found in the care plan section) Acute Rehab PT Goals PT Goal Formulation: With patient Time For Goal Achievement: 06/23/19 Potential to Achieve Goals: Good Progress towards PT goals: Progressing toward goals    Frequency    Min 5X/week      PT Plan Current plan remains appropriate    Co-evaluation              AM-PAC PT "6 Clicks" Mobility   Outcome Measure  Help needed turning from your back to  your side while in a flat bed without using bedrails?: None Help needed moving from lying on your back to sitting on the side of a flat bed without using bedrails?: None Help needed moving to and from a bed to a chair (including a wheelchair)?: None Help needed standing up from a chair using your arms (e.g., wheelchair or bedside chair)?: None Help needed to walk in hospital room?: None Help needed climbing 3-5 steps with a railing? : None 6 Click Score: 24    End of Session Equipment Utilized During Treatment: Back brace Activity Tolerance: Patient tolerated treatment well Patient left: in chair;with call bell/phone within reach Nurse Communication: Mobility status PT Visit Diagnosis: Other abnormalities of gait and mobility (R26.89);Pain Pain - part of body: (back)     Time: [id number] PT Time Calculation (min) (ACUTE ONLY): 23  min  Charges:  $Therapeutic Activity: 8-22 mins $Self Care/Home Management: 8-22                     Anastasio Champion, DPT  Acute Rehabilitation Services Pager [phone] Office [place] 06/21/2019, 9:31 AM

## 2019-06-21 NOTE — Discharge Summary (Signed)
Physician Discharge Summary  Patient ID: Diana Blevins MRN: [id number] DOB/AGE: [birth year] 54 y.o.  Admit date: 06/20/2019 Discharge date: 06/21/2019  Admission Diagnoses: Lumbar spondylolisthesis  Discharge Diagnoses: The same Active Problems:   Degenerative spondylolisthesis   Discharged Condition: good  Hospital Course: Dr. Annette Stable performed an L4-5 decompression, instrumentation and fusion on the patient on 06/20/2019.  The patient's postoperative course was unremarkable.  On postoperative day #1 she requested discharge to home.  She was given written and oral discharge instructions.  All questions were answered.  Consults: PT, OT, care management Significant Diagnostic Studies: None Treatments: L4-5 decompression, instrumentation and fusion. Discharge Exam: Blood pressure (!) 138/93, pulse 77, temperature 98.2 F (36.8 C), temperature source Oral, resp. rate 18, last menstrual period 05/26/2019, SpO2 99 %. The patient is alert and pleasant.  She looks well.  Her strength is normal.  Disposition: Home  Discharge Instructions    Call MD for:  difficulty breathing, headache or visual disturbances   Complete by: As directed    Call MD for:  extreme fatigue   Complete by: As directed    Call MD for:  hives   Complete by: As directed    Call MD for:  persistant dizziness or light-headedness   Complete by: As directed    Call MD for:  persistant nausea and vomiting   Complete by: As directed    Call MD for:  redness, tenderness, or signs of infection (pain, swelling, redness, odor or green/yellow discharge around incision site)   Complete by: As directed    Call MD for:  severe uncontrolled pain   Complete by: As directed    Call MD for:  temperature >100.4   Complete by: As directed    Diet - low sodium heart healthy   Complete by: As directed    Discharge instructions   Complete by: As directed    Call [phone] for a followup appointment. Take a stool softener while  you are using pain medications.   Driving Restrictions   Complete by: As directed    Do not drive for 2 weeks.   Increase activity slowly   Complete by: As directed    Lifting restrictions   Complete by: As directed    Do not lift more than 5 pounds. No excessive bending or twisting.   May shower / Bathe   Complete by: As directed    Remove the dressing for 3 days after surgery.  You may shower, but leave the incision alone.   Remove dressing in 48 hours   Complete by: As directed    Your stitches are under the scan and will dissolve by themselves. The Steri-Strips will fall off after you take a few showers. Do not rub back or pick at the wound, Leave the wound alone.     Allergies as of 06/21/2019      Reactions   Bee Venom Anaphylaxis   Dust Mite Extract Shortness Of Breath, Swelling   Eye swelling   Latex Itching   Peanut-containing Drug Products    Throat and ears itch      Medication List    STOP taking these medications   predniSONE 10 MG tablet Commonly known as: DELTASONE   Ultram 50 MG tablet Generic drug: traMADol     TAKE these medications   amLODipine 5 MG tablet Commonly known as: NORVASC Take 5 mg by mouth daily.   BLACK CURRANT SEED OIL PO Take 15 mLs by mouth daily.  cyclobenzaprine 10 MG tablet Commonly known as: FLEXERIL Take 1 tablet (10 mg total) by mouth 3 (three) times daily as needed for muscle spasms.   lidocaine 5 % Commonly known as: LIDODERM Place 1 patch onto the skin daily. Remove & Discard patch within 12 hours or as directed by MD   OVER THE COUNTER MEDICATION Take 2 capsules by mouth daily. [place] Advance   Oxycodone HCl 10 MG Tabs Take 1 tablet (10 mg total) by mouth every 4 (four) hours as needed for severe pain ((score 7 to 10)).            Durable Medical Equipment  (From admission, onward)         Start     Ordered   06/20/19 1714  DME Walker rolling  Once    Question:  Patient needs a walker to treat with  the following condition  Answer:  Degenerative spondylolisthesis   06/20/19 1713   06/20/19 1714  DME 3 n 1  Once     06/20/19 1713           Signed: Ophelia Charter 06/21/2019, 8:00 AM

## 2019-06-21 NOTE — Evaluation (Signed)
Occupational Therapy Evaluation Patient Details Name: Diana Blevins MRN: [id number] DOB: February 07, 1966 Today's Date: 06/21/2019    History of Present Illness Pt is a 54 y/o female with PMH including but not limited to HTN and neuropathy, admitted with significant radicular pain to her L lower extremeity. Pt is now s/p L4-L5 decompressive lami/foraminotomies, microdiscectomies and PLIF/PLA with autografting.   Clinical Impression   Pt PTA: Pt living family and reports independence with ADL and mobility prior. Pt currently Pt performing own LB ADL routine, standing at sink for grooming and toileting in sitting without breaking back precautions. Pt requiring no cues to adhere to precautions. Pt tolerating session well. Back handout provided and reviewed ADL in detail. Pt educated on: clothing between brace, never sleep in brace, set an alarm at night for medication, avoid sitting for long periods of time, correct bed positioning for sleeping, correct sequence for bed mobility, avoiding lifting more than 5 pounds and never wash directly over incision. All education is complete and patient indicates understanding. Pt does not require continued OT skilled services. OT signing off.      Follow Up Recommendations  No OT follow up    Equipment Recommendations  3 in 1 bedside commode    Recommendations for Other Services       Precautions / Restrictions Precautions Precautions: Back Precaution Booklet Issued: Yes (comment) Precaution Comments: reviewed 3/3 back precautions with pt throughout Required Braces or Orthoses: Spinal Brace Spinal Brace: Lumbar corset;Applied in sitting position Restrictions Weight Bearing Restrictions: No      Mobility Bed Mobility Overal bed mobility: Needs Assistance Bed Mobility: Sidelying to Sit   Sidelying to sit: Min guard       General bed mobility comments: cueing for log roll technique  Transfers Overall transfer level: Needs  assistance Equipment used: None Transfers: Sit to/from Stand Sit to Stand: Supervision         General transfer comment: for safety    Balance Overall balance assessment: No apparent balance deficits (not formally assessed)                                         ADL either performed or assessed with clinical judgement   ADL Overall ADL's : At baseline                                       General ADL Comments: Pt performing own LB ADL routine, standing at sink for grooming and toileting in sitting without breaking back precautions. Pt requiring no cues to adhere to precautions.     Vision Baseline Vision/History: No visual deficits Patient Visual Report: No change from baseline Vision Assessment?: No apparent visual deficits     Perception     Praxis      Pertinent Vitals/Pain Pain Assessment: Faces Faces Pain Scale: Hurts little more Pain Location: back incision  Pain Descriptors / Indicators: Pressure;Sore Pain Intervention(s): Monitored during session     Hand Dominance Right   Extremity/Trunk Assessment Upper Extremity Assessment Upper Extremity Assessment: Overall WFL for tasks assessed   Lower Extremity Assessment Lower Extremity Assessment: Defer to PT evaluation;Overall [facility] for tasks assessed   Cervical / Trunk Assessment Cervical / Trunk Assessment: Other exceptions Cervical / Trunk Exceptions: s/p back sx   Communication Communication Communication: No difficulties  Cognition Arousal/Alertness: Awake/alert Behavior During Therapy: WFL for tasks assessed/performed Overall Cognitive Status: Within Functional Limits for tasks assessed                                     General Comments  Pt reports that she has a supportive family who can assist as needed.    Exercises     Shoulder Instructions      Home Living Family/patient expects to be discharged to:: Private residence Living  Arrangements: Other relatives Available Help at Discharge: Family;Available 24 hours/day Type of Home: Mobile home Home Access: Stairs to enter Entrance Stairs-Number of Steps: 2 Entrance Stairs-Rails: None Home Layout: One level     Bathroom Shower/Tub: Occupational psychologist: Standard     Home Equipment: Shower seat          Prior Functioning/Environment Level of Independence: Independent                 OT Problem List:        OT Treatment/Interventions:      OT Goals(Current goals can be found in the care plan section) Acute Rehab OT Goals Patient Stated Goal: Home and able to do things I want OT Goal Formulation: With patient  OT Frequency:     Barriers to D/C:            Co-evaluation              AM-PAC OT "6 Clicks" Daily Activity     Outcome Measure Help from another person eating meals?: None Help from another person taking care of personal grooming?: None Help from another person toileting, which includes using toliet, bedpan, or urinal?: None Help from another person bathing (including washing, rinsing, drying)?: A Little Help from another person to put on and taking off regular upper body clothing?: None Help from another person to put on and taking off regular lower body clothing?: A Little 6 Click Score: 22   End of Session Equipment Utilized During Treatment: Back brace Nurse Communication: Mobility status;Precautions  Activity Tolerance: Patient tolerated treatment well Patient left: in chair;with call bell/phone within reach  OT Visit Diagnosis: Unsteadiness on feet (R26.81);Pain Pain - part of body: (back)                Time: [id number] OT Time Calculation (min): 31 min Charges:  OT General Charges $OT Visit: 1 Visit OT Evaluation $OT Eval Moderate Complexity: 1 Mod OT Treatments $Self Care/Home Management : 8-22 mins  Jefferey Pica, OTR/L Acute Rehabilitation Services Pager: [phone] Office:  [phone]   Marcellous Snarski C 06/21/2019, 1:36 PM

## 2019-06-23 MED FILL — Thrombin For Soln 20000 Unit: CUTANEOUS | Qty: 1 | Status: AC

## 2019-06-23 MED FILL — Thrombin For Soln Kit 20000 Unit: CUTANEOUS | Qty: 1 | Status: AC

## 2019-07-03 MED FILL — Heparin Sodium (Porcine) Inj 1000 Unit/ML: INTRAMUSCULAR | Qty: 30 | Status: AC

## 2019-07-03 MED FILL — Sodium Chloride IV Soln 0.9%: INTRAVENOUS | Qty: 1000 | Status: AC

## 2019-11-01 ENCOUNTER — Encounter (HOSPITAL_COMMUNITY): Payer: Self-pay

## 2019-11-01 ENCOUNTER — Other Ambulatory Visit: Payer: Self-pay

## 2019-11-01 ENCOUNTER — Emergency Department (HOSPITAL_COMMUNITY)
Admission: EM | Admit: 2019-11-01 | Discharge: 2019-11-01 | Disposition: A | Discharge status: Home / Self Care | Payer: PRIVATE HEALTH INSURANCE | Attending: Emergency Medicine | Admitting: Emergency Medicine

## 2019-11-01 DIAGNOSIS — L03114 Cellulitis of left upper limb: Secondary | ICD-10-CM | POA: Diagnosis not present

## 2019-11-01 DIAGNOSIS — Z79899 Other long term (current) drug therapy: Secondary | ICD-10-CM | POA: Insufficient documentation

## 2019-11-01 DIAGNOSIS — I10 Essential (primary) hypertension: Secondary | ICD-10-CM | POA: Insufficient documentation

## 2019-11-01 DIAGNOSIS — Y999 Unspecified external cause status: Secondary | ICD-10-CM | POA: Diagnosis not present

## 2019-11-01 DIAGNOSIS — Z91010 Allergy to peanuts: Secondary | ICD-10-CM | POA: Insufficient documentation

## 2019-11-01 DIAGNOSIS — W57XXXA Bitten or stung by nonvenomous insect and other nonvenomous arthropods, initial encounter: Secondary | ICD-10-CM | POA: Insufficient documentation

## 2019-11-01 DIAGNOSIS — Y939 Activity, unspecified: Secondary | ICD-10-CM | POA: Insufficient documentation

## 2019-11-01 DIAGNOSIS — Y929 Unspecified place or not applicable: Secondary | ICD-10-CM | POA: Diagnosis not present

## 2019-11-01 DIAGNOSIS — Z91040 Latex allergy status: Secondary | ICD-10-CM | POA: Insufficient documentation

## 2019-11-01 DIAGNOSIS — S50862A Insect bite (nonvenomous) of left forearm, initial encounter: Secondary | ICD-10-CM | POA: Diagnosis present

## 2019-11-01 DIAGNOSIS — Z87891 Personal history of nicotine dependence: Secondary | ICD-10-CM | POA: Diagnosis not present

## 2019-11-01 MED ORDER — CEPHALEXIN 500 MG PO CAPS: 500.0000 mg | ORAL_CAPSULE | Freq: Four times a day (QID) | ORAL | 0 refills | Status: DC

## 2019-11-01 MED ORDER — DIPHENHYDRAMINE HCL 25 MG PO CAPS
25.0000 mg | ORAL_CAPSULE | Freq: Once | ORAL | Status: AC
  Administered 2019-11-01: 25 mg via ORAL
  Filled 2019-11-01: qty 1

## 2019-11-01 MED ORDER — PREDNISONE 20 MG PO TABS: 40.0000 mg | ORAL_TABLET | Freq: Every day | ORAL | 0 refills | Status: DC

## 2019-11-01 MED ORDER — CEPHALEXIN 500 MG PO CAPS
500.0000 mg | ORAL_CAPSULE | Freq: Once | ORAL | Status: AC
  Administered 2019-11-01: 500 mg via ORAL
  Filled 2019-11-01: qty 1

## 2019-11-01 MED ORDER — PREDNISONE 20 MG PO TABS
40.0000 mg | ORAL_TABLET | Freq: Once | ORAL | Status: AC
  Administered 2019-11-01: 40 mg via ORAL
  Filled 2019-11-01: qty 2

## 2019-11-01 NOTE — Discharge Instructions (Signed)
Continue to take benadryl 25-50 mg every 6 hours as needed for itching.

## 2019-11-01 NOTE — ED Notes (Signed)
Stung last night by a ? Yellow jacket   Benadryl last night and made a mark on arm where redness stopped   This am redness above mark and is here for eval   Has had no benadryl today

## 2019-11-01 NOTE — ED Triage Notes (Signed)
Pt reports she was stung by bee yesterday approx 5 pm left forearrm. Redness and swelling moving up arm
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
## 2019-11-23 ENCOUNTER — Encounter (HOSPITAL_COMMUNITY): Payer: Self-pay | Admitting: Emergency Medicine

## 2019-11-23 ENCOUNTER — Emergency Department (HOSPITAL_COMMUNITY)
Admission: EM | Admit: 2019-11-23 | Discharge: 2019-11-23 | Disposition: A | Discharge status: Home / Self Care | Payer: PRIVATE HEALTH INSURANCE | Attending: Emergency Medicine | Admitting: Emergency Medicine

## 2019-11-23 ENCOUNTER — Other Ambulatory Visit: Payer: Self-pay

## 2019-11-23 DIAGNOSIS — Z91010 Allergy to peanuts: Secondary | ICD-10-CM | POA: Insufficient documentation

## 2019-11-23 DIAGNOSIS — I10 Essential (primary) hypertension: Secondary | ICD-10-CM | POA: Insufficient documentation

## 2019-11-23 DIAGNOSIS — Z79899 Other long term (current) drug therapy: Secondary | ICD-10-CM | POA: Insufficient documentation

## 2019-11-23 DIAGNOSIS — R6884 Jaw pain: Secondary | ICD-10-CM | POA: Diagnosis not present

## 2019-11-23 DIAGNOSIS — K0889 Other specified disorders of teeth and supporting structures: Secondary | ICD-10-CM | POA: Diagnosis not present

## 2019-11-23 DIAGNOSIS — F1590 Other stimulant use, unspecified, uncomplicated: Secondary | ICD-10-CM | POA: Insufficient documentation

## 2019-11-23 DIAGNOSIS — T7840XA Allergy, unspecified, initial encounter: Secondary | ICD-10-CM | POA: Diagnosis present

## 2019-11-23 DIAGNOSIS — Z87891 Personal history of nicotine dependence: Secondary | ICD-10-CM | POA: Diagnosis not present

## 2019-11-23 DIAGNOSIS — K1379 Other lesions of oral mucosa: Secondary | ICD-10-CM

## 2019-11-23 DIAGNOSIS — Z91040 Latex allergy status: Secondary | ICD-10-CM | POA: Diagnosis not present

## 2019-11-23 MED ORDER — NYSTATIN 100000 UNIT/ML MT SUSP: 5.0000 mL | Freq: Four times a day (QID) | OROMUCOSAL | 0 refills | Status: DC

## 2019-11-23 NOTE — ED Triage Notes (Addendum)
Pt states she was stung by a bee a "few weeks ago." States she was seen in the ED and given medication including prednisone and then she was stung a week later. Reports after the second sting she felt like her tongue was "bigger." States that since then she has been taking her prednisone and benadryl, but when she eats her throat is "sore." NAD noted at this time. Denies hives or SOB.
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
## 2019-11-23 NOTE — Discharge Instructions (Signed)
See your Physician for recheck.  °

## 2019-11-23 NOTE — ED Notes (Signed)
Pt was here several weeks ago for bee sting Treated and meds provided   Then stung again   Here some time later for "allergic reaction" evaluation

## 2021-01-04 ENCOUNTER — Emergency Department (HOSPITAL_COMMUNITY)
Admission: EM | Admit: 2021-01-04 | Discharge: 2021-01-04 | Disposition: A | Discharge status: Home / Self Care | Payer: 59 | Attending: Emergency Medicine | Admitting: Emergency Medicine

## 2021-01-04 ENCOUNTER — Encounter (HOSPITAL_COMMUNITY): Payer: Self-pay | Admitting: Emergency Medicine

## 2021-01-04 DIAGNOSIS — Z91010 Allergy to peanuts: Secondary | ICD-10-CM | POA: Diagnosis not present

## 2021-01-04 DIAGNOSIS — Z91040 Latex allergy status: Secondary | ICD-10-CM | POA: Insufficient documentation

## 2021-01-04 DIAGNOSIS — I10 Essential (primary) hypertension: Secondary | ICD-10-CM | POA: Insufficient documentation

## 2021-01-04 DIAGNOSIS — M7989 Other specified soft tissue disorders: Secondary | ICD-10-CM

## 2021-01-04 DIAGNOSIS — R2232 Localized swelling, mass and lump, left upper limb: Secondary | ICD-10-CM | POA: Insufficient documentation

## 2021-01-04 DIAGNOSIS — Z79899 Other long term (current) drug therapy: Secondary | ICD-10-CM | POA: Diagnosis not present

## 2021-01-04 DIAGNOSIS — Z87891 Personal history of nicotine dependence: Secondary | ICD-10-CM | POA: Diagnosis not present

## 2021-01-04 MED ORDER — DOXYCYCLINE HYCLATE 100 MG PO CAPS: 100.0000 mg | ORAL_CAPSULE | Freq: Two times a day (BID) | ORAL | 0 refills | Status: AC

## 2021-01-04 MED ORDER — DIPHENHYDRAMINE-ZINC ACETATE 2-0.1 % EX CREA: 1.0000 "application " | TOPICAL_CREAM | Freq: Three times a day (TID) | CUTANEOUS | 0 refills | Status: DC | PRN

## 2021-01-04 MED ORDER — DOXYCYCLINE HYCLATE 100 MG PO TABS
100.0000 mg | ORAL_TABLET | Freq: Once | ORAL | Status: AC
  Administered 2021-01-04: 100 mg via ORAL
  Filled 2021-01-04: qty 1

## 2021-01-04 NOTE — ED Triage Notes (Addendum)
Pt states yesterday afternoon she felt something on her left arm. Pt states she thinks she was bit by an insect --unsure of the type. Pt has history of reaction similar with bee bites/stings. Pt states yesterday she took 6 Diphenhydramine 25 mg then went to sleep for the night. States she woke up this morning with swelling of arm and location is warm to touch. Swelling is bellow the left elbow. Pt states she feels pain with touch. Denies numbness or tingling in distal end of affected extremity.

## 2021-01-04 NOTE — Discharge Instructions (Addendum)
You were given a prescription for antibiotics. Please take the antibiotic prescription fully.   You can take claritin or zyrtec to help with the itching.   You can also use benadryl cream as well for the itching  Please follow up with your primary care provider within 5-7 days for re-evaluation of your symptoms. If you do not have a primary care provider, information for a healthcare clinic has been provided for you to make arrangements for follow up care. Please return to the emergency department for any new or worsening symptoms.
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
## 2021-01-31 ENCOUNTER — Encounter: Payer: Self-pay | Admitting: Internal Medicine
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
## 2021-06-09 ENCOUNTER — Ambulatory Visit: Payer: PRIVATE HEALTH INSURANCE | Admitting: Gastroenterology

## 2022-02-22 DIAGNOSIS — L259 Unspecified contact dermatitis, unspecified cause: Secondary | ICD-10-CM | POA: Diagnosis not present

## 2022-03-30 DIAGNOSIS — Z713 Dietary counseling and surveillance: Secondary | ICD-10-CM | POA: Diagnosis not present

## 2022-03-30 DIAGNOSIS — R6889 Other general symptoms and signs: Secondary | ICD-10-CM | POA: Diagnosis not present

## 2022-03-30 DIAGNOSIS — I10 Essential (primary) hypertension: Secondary | ICD-10-CM | POA: Diagnosis not present

## 2022-03-30 DIAGNOSIS — Z1211 Encounter for screening for malignant neoplasm of colon: Secondary | ICD-10-CM | POA: Diagnosis not present

## 2022-03-30 DIAGNOSIS — R21 Rash and other nonspecific skin eruption: Secondary | ICD-10-CM | POA: Diagnosis not present

## 2022-05-05 DIAGNOSIS — Z91030 Bee allergy status: Secondary | ICD-10-CM | POA: Diagnosis not present

## 2022-05-05 DIAGNOSIS — J302 Other seasonal allergic rhinitis: Secondary | ICD-10-CM | POA: Diagnosis not present

## 2022-05-05 DIAGNOSIS — Z6841 Body Mass Index (BMI) 40.0 and over, adult: Secondary | ICD-10-CM | POA: Diagnosis not present

## 2022-05-05 DIAGNOSIS — I10 Essential (primary) hypertension: Secondary | ICD-10-CM | POA: Diagnosis not present

## 2022-05-05 DIAGNOSIS — I499 Cardiac arrhythmia, unspecified: Secondary | ICD-10-CM | POA: Diagnosis not present

## 2022-05-09 DIAGNOSIS — I499 Cardiac arrhythmia, unspecified: Secondary | ICD-10-CM | POA: Insufficient documentation

## 2022-05-09 DIAGNOSIS — Z6841 Body Mass Index (BMI) 40.0 and over, adult: Secondary | ICD-10-CM | POA: Insufficient documentation

## 2022-05-11 ENCOUNTER — Ambulatory Visit: Payer: 59 | Admitting: Orthopaedic Surgery

## 2022-05-11 ENCOUNTER — Telehealth: Payer: Self-pay | Admitting: Orthopaedic Surgery

## 2022-05-11 NOTE — Telephone Encounter (Signed)
Patient came in for her appointment.  She has [employer].  Per her card she has a $100 copay.  She stated that she had called [employer] and they told her they were going to fix it on her card so she'd only have to pay [place] $15.  I called [employer] and spoke with Deana and she confirmed that the patient has a $100 copay.  The patient asked to call back and reschedule when she figures out her insurance.

## 2022-05-29 ENCOUNTER — Ambulatory Visit: Payer: PRIVATE HEALTH INSURANCE | Admitting: Internal Medicine
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
## 2022-06-06 ENCOUNTER — Ambulatory Visit: Payer: PRIVATE HEALTH INSURANCE | Attending: Internal Medicine | Admitting: Internal Medicine

## 2022-07-17 ENCOUNTER — Ambulatory Visit: Payer: PRIVATE HEALTH INSURANCE | Attending: Internal Medicine | Admitting: Internal Medicine

## 2022-07-17 ENCOUNTER — Encounter: Payer: Self-pay | Admitting: Internal Medicine

## 2022-07-17 VITALS — BP 188/92 | HR 68 | Ht 65.0 in | Wt 287.4 lb

## 2022-07-17 DIAGNOSIS — R0681 Apnea, not elsewhere classified: Secondary | ICD-10-CM | POA: Diagnosis not present
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
## 2022-07-17 NOTE — Patient Instructions (Signed)
Medication Instructions:  No Changes In Medications at this time.   *If you need a refill on your cardiac medications before your next appointment, please call your pharmacy*  Lab Work: None Ordered At This Time.   If you have labs (blood work) drawn today and your tests are completely normal, you will receive your results only by: MyChart Message (if you have MyChart) OR A paper copy in the mail If you have any lab test that is abnormal or we need to change your treatment, we will call you to review the results.  Testing/Procedures: REFERRAL TO PULMONOLOGY- SOMEONE WILL REACH OUT TO GET YOU SCHEDULED FOR THIS   Follow-Up: At [facility], you and your health needs are our priority.  As part of our continuing mission to provide you with exceptional heart care, we have created designated Provider Care Teams.  These Care Teams include your primary Cardiologist (physician) and Advanced Practice Providers (APPs -  Physician Assistants and Nurse Practitioners) who all work together to provide you with the care you need, when you need it.   Your next appointment:   AS NEEDED   Provider:   Maisie Fus, MD

## 2022-08-03 ENCOUNTER — Institutional Professional Consult (permissible substitution): Payer: PRIVATE HEALTH INSURANCE | Admitting: Internal Medicine

## 2022-11-17 ENCOUNTER — Other Ambulatory Visit: Payer: Self-pay

## 2022-11-17 ENCOUNTER — Emergency Department (HOSPITAL_COMMUNITY)
Admission: EM | Admit: 2022-11-17 | Discharge: 2022-11-17 | Disposition: A | Discharge status: Against Medical Advice | Payer: PRIVATE HEALTH INSURANCE | Attending: Emergency Medicine | Admitting: Emergency Medicine

## 2022-11-17 ENCOUNTER — Encounter (HOSPITAL_COMMUNITY): Payer: Self-pay | Admitting: Emergency Medicine

## 2022-11-17 DIAGNOSIS — M79604 Pain in right leg: Secondary | ICD-10-CM | POA: Insufficient documentation

## 2022-11-17 DIAGNOSIS — Z5321 Procedure and treatment not carried out due to patient leaving prior to being seen by health care provider: Secondary | ICD-10-CM | POA: Diagnosis not present

## 2022-11-17 NOTE — ED Triage Notes (Signed)
Pt with c/o R leg pain. States it hurts when she lays down. Travels from back of thigh to calf area.

## 2022-11-20 ENCOUNTER — Emergency Department (HOSPITAL_COMMUNITY): Payer: PRIVATE HEALTH INSURANCE

## 2022-11-20 ENCOUNTER — Other Ambulatory Visit: Payer: Self-pay

## 2022-11-20 ENCOUNTER — Encounter (HOSPITAL_COMMUNITY): Payer: Self-pay | Admitting: Emergency Medicine

## 2022-11-20 ENCOUNTER — Emergency Department (HOSPITAL_COMMUNITY)
Admission: EM | Admit: 2022-11-20 | Discharge: 2022-11-20 | Disposition: A | Discharge status: Home / Self Care | Payer: PRIVATE HEALTH INSURANCE | Attending: Emergency Medicine | Admitting: Emergency Medicine

## 2022-11-20 DIAGNOSIS — M5431 Sciatica, right side: Secondary | ICD-10-CM | POA: Diagnosis not present

## 2022-11-20 DIAGNOSIS — Z91040 Latex allergy status: Secondary | ICD-10-CM | POA: Insufficient documentation

## 2022-11-20 DIAGNOSIS — M25551 Pain in right hip: Secondary | ICD-10-CM | POA: Diagnosis present

## 2022-11-20 DIAGNOSIS — Z79899 Other long term (current) drug therapy: Secondary | ICD-10-CM | POA: Insufficient documentation

## 2022-11-20 DIAGNOSIS — Z91010 Allergy to peanuts: Secondary | ICD-10-CM | POA: Diagnosis not present

## 2022-11-20 DIAGNOSIS — R252 Cramp and spasm: Secondary | ICD-10-CM | POA: Insufficient documentation

## 2022-11-20 LAB — CBC WITH DIFFERENTIAL/PLATELET
Abs Immature Granulocytes: 0.02 10*3/uL (ref 0.00–0.07)
Basophils Absolute: 0 10*3/uL (ref 0.0–0.1)
Basophils Relative: 1 %
Eosinophils Absolute: 0.2 10*3/uL (ref 0.0–0.5)
Eosinophils Relative: 2 %
HCT: 38.1 % (ref 36.0–46.0)
Hemoglobin: 12.3 g/dL (ref 12.0–15.0)
Immature Granulocytes: 0 %
Lymphocytes Relative: 23 %
Lymphs Abs: 2 10*3/uL (ref 0.7–4.0)
MCH: 29.8 pg (ref 26.0–34.0)
MCHC: 32.3 g/dL (ref 30.0–36.0)
MCV: 92.3 fL (ref 80.0–100.0)
Monocytes Absolute: 0.6 10*3/uL (ref 0.1–1.0)
Monocytes Relative: 6 %
Neutro Abs: 5.9 10*3/uL (ref 1.7–7.7)
Neutrophils Relative %: 68 %
Platelets: 245 10*3/uL (ref 150–400)
RBC: 4.13 MIL/uL (ref 3.87–5.11)
RDW: 12.3 % (ref 11.5–15.5)
WBC: 8.7 10*3/uL (ref 4.0–10.5)
nRBC: 0 % (ref 0.0–0.2)

## 2022-11-20 LAB — BASIC METABOLIC PANEL
Anion gap: 4 — ABNORMAL LOW (ref 5–15)
BUN: 13 mg/dL (ref 6–20)
CO2: 27 mmol/L (ref 22–32)
Calcium: 9 mg/dL (ref 8.9–10.3)
Chloride: 103 mmol/L (ref 98–111)
Creatinine, Ser: 0.71 mg/dL (ref 0.44–1.00)
GFR, Estimated: >60 mL/min (ref >60)
Glucose, Bld: 93 mg/dL (ref 70–99)
Potassium: 3.7 mmol/L (ref 3.5–5.1)
Sodium: 134 mmol/L — ABNORMAL LOW (ref 135–145)

## 2022-11-20 MED ORDER — OXYCODONE-ACETAMINOPHEN 5-325 MG PO TABS: 1.0000 | ORAL_TABLET | ORAL | 0 refills | Status: DC | PRN

## 2022-11-20 MED ORDER — OXYCODONE-ACETAMINOPHEN 5-325 MG PO TABS
1.0000 | ORAL_TABLET | Freq: Once | ORAL | Status: AC
  Administered 2022-11-20: 1 via ORAL
  Filled 2022-11-20: qty 1

## 2022-11-20 MED ORDER — IBUPROFEN 800 MG PO TABS: 800.0000 mg | ORAL_TABLET | Freq: Three times a day (TID) | ORAL | 0 refills | Status: DC

## 2022-11-20 MED ORDER — METHOCARBAMOL 500 MG PO TABS
500.0000 mg | ORAL_TABLET | Freq: Once | ORAL | Status: AC
  Administered 2022-11-20: 500 mg via ORAL
  Filled 2022-11-20: qty 1

## 2022-11-20 MED ORDER — METHOCARBAMOL 500 MG PO TABS: 500.0000 mg | ORAL_TABLET | Freq: Three times a day (TID) | ORAL | 0 refills | Status: AC

## 2022-11-20 NOTE — ED Triage Notes (Signed)
Pt presents with right leg pain, radiating from buttock, down to lower leg, denies any injury, seen for same 11/17/22.

## 2022-11-20 NOTE — Discharge Instructions (Signed)
Alternate ice and heat to your lower back and buttock area.  Avoid twisting bending or heavy lifting for at least 1 week.  Take the medication as directed.  Please follow-up with your primary care provider for recheck or return to the emergency department for any new or worsening symptoms.

## 2022-11-20 NOTE — ED Provider Notes (Signed)
[place] EMERGENCY DEPARTMENT AT [facility] Provider Note   CSN: [id number] Arrival date & time: 11/20/22  1607     History {Add pertinent medical, surgical, social history, OB history to HPI:1} Chief Complaint  Patient presents with   Claudication    Right leg    Diana Blevins is a 57 y.o. female.  HPI     Home Medications Prior to Admission medications   Medication Sig Start Date End Date Taking? Authorizing Provider  amLODipine (NORVASC) 10 MG tablet Take 10 mg by mouth daily.    [provider]  BLACK CURRANT SEED OIL PO Take 15 mLs by mouth daily.    [provider]  cephALEXin (KEFLEX) 500 MG capsule Take 1 capsule (500 mg total) by mouth 4 (four) times daily. 11/01/19   Raeford Razor, MD  cyclobenzaprine (FLEXERIL) 10 MG tablet Take 1 tablet (10 mg total) by mouth 3 (three) times daily as needed for muscle spasms. 06/21/19   Tressie Stalker, MD  diphenhydrAMINE-zinc acetate (BENADRYL EXTRA STRENGTH) cream Apply 1 application topically 3 (three) times daily as needed for itching. 01/04/21   Couture, Cortni S, PA-C  EPINEPHrine 0.3 mg/0.3 mL IJ SOAJ injection Inject 0.3 mg into the muscle as needed for anaphylaxis.    [provider]  escitalopram (LEXAPRO) 10 MG tablet 1 tablet Orally Once a day for 30 day(s)    [provider]  FLONASE ALLERGY RELIEF 50 MCG/ACT nasal spray Place 2 sprays into both nostrils daily.    [provider]  lidocaine (LIDODERM) 5 % Place 1 patch onto the skin daily. Remove & Discard patch within 12 hours or as directed by MD    [provider]  nystatin (MYCOSTATIN) 100000 UNIT/ML suspension Take 5 mLs (500,000 Units total) by mouth 4 (four) times daily. Swish in mouth 11/23/19   Elson Areas, PA-C  OVER THE COUNTER MEDICATION Take 2 capsules by mouth daily. [facility] Advance    [provider]  oxyCODONE 10 MG TABS Take 1 tablet (10 mg total) by mouth every 4 (four)  hours as needed for severe pain ((score 7 to 10)). 06/21/19   Tressie Stalker, MD  predniSONE (DELTASONE) 20 MG tablet Take 2 tablets (40 mg total) by mouth daily. 11/01/19   Raeford Razor, MD  loratadine (CLARITIN) 10 MG tablet Take 1 tablet (10 mg total) by mouth daily. Patient not taking: Reported on 05/10/2019 05/21/17 05/10/19  Jacquelin Hawking, PA-C  triamterene-hydrochlorothiazide (MAXZIDE-25) 37.5-25 MG tablet Take 1 tablet by mouth daily. Patient not taking: Reported on 12/03/2018 05/21/17 05/10/19  Jacquelin Hawking, PA-C      Allergies    Bee venom, Dust mite extract, Latex, and Peanut-containing drug products    Review of Systems   Review of Systems  Physical Exam Updated Vital Signs BP (!) 155/83   Pulse 66   Temp 98.6 F (37 C) (Oral)   Resp 12   Ht 5\' 5"  (1.651 m)   Wt 128.4 kg   SpO2 100%   BMI 47.11 kg/m  Physical Exam  ED Results / Procedures / Treatments   Labs (all labs ordered are listed, but only abnormal results are displayed) Labs Reviewed  BASIC METABOLIC PANEL - Abnormal; Notable for the following components:      Result Value   Sodium 134 (*)    Anion gap 4 (*)    All other components within normal limits  CBC WITH DIFFERENTIAL/PLATELET    EKG None  Radiology  DG Lumbar Spine Complete  Result Date: 11/20/2022 CLINICAL DATA:  Back pain for couple of weeks extending to the knee. EXAM: LUMBAR SPINE - COMPLETE 5 VIEW COMPARISON:  10/31/2019. FINDINGS: There is no evidence of lumbar spine fracture. Alignment is normal. Postop changes posterior pedicle discectomy L4-5 IMPRESSION: Postop changes L4-5 status post fusion and discectomy. No acute osseous abnormalities. Electronically Signed   By: Layla Maw M.D.   On: 11/20/2022 21:10    Procedures Procedures  {Document cardiac monitor, telemetry assessment procedure when appropriate:1}  Medications Ordered in ED Medications - No data to display  ED Course/ Medical Decision Making/ A&P   {    Click here for ABCD2, HEART and other calculatorsREFRESH Note before signing :1}                              Medical Decision Making Amount and/or Complexity of Data Reviewed Labs: ordered. Radiology: ordered. Discussion of management or test interpretation with external provider(s): Patient with radicular type symptoms of the right lower extremity.  Chemistries without derangement.  I suspect this is secondary to sciatica.  Patient will follow-up with PCP.     {Document critical care time when appropriate:1} {Document review of labs and clinical decision tools ie heart score, Chads2Vasc2 etc:1}  {Document your independent review of radiology images, and any outside records:1} {Document your discussion with family members, caretakers, and with consultants:1} {Document social determinants of health affecting pt's care:1} {Document your decision making why or why not admission, treatments were needed:1} Final Clinical Impression(s) / ED Diagnoses Final diagnoses:  None    Rx / DC Orders ED Discharge Orders     None

## 2022-12-01 ENCOUNTER — Other Ambulatory Visit (HOSPITAL_COMMUNITY): Payer: Self-pay | Admitting: Nurse Practitioner

## 2022-12-01 DIAGNOSIS — M25552 Pain in left hip: Secondary | ICD-10-CM

## 2022-12-01 DIAGNOSIS — M549 Dorsalgia, unspecified: Secondary | ICD-10-CM

## 2023-02-07 ENCOUNTER — Other Ambulatory Visit (INDEPENDENT_AMBULATORY_CARE_PROVIDER_SITE_OTHER): Payer: No Typology Code available for payment source

## 2023-02-07 ENCOUNTER — Ambulatory Visit: Payer: No Typology Code available for payment source | Admitting: Orthopaedic Surgery

## 2023-02-07 ENCOUNTER — Encounter: Payer: Self-pay | Admitting: Orthopaedic Surgery

## 2023-02-07 VITALS — BP 150/84 | HR 85 | Ht 65.0 in | Wt 268.0 lb

## 2023-02-07 DIAGNOSIS — G8929 Other chronic pain: Secondary | ICD-10-CM | POA: Diagnosis not present

## 2023-02-07 DIAGNOSIS — M25561 Pain in right knee: Secondary | ICD-10-CM

## 2023-02-07 DIAGNOSIS — Z6841 Body Mass Index (BMI) 40.0 and over, adult: Secondary | ICD-10-CM

## 2023-02-07 MED ORDER — NAPROXEN 500 MG PO TABS: 500.0000 mg | ORAL_TABLET | Freq: Two times a day (BID) | ORAL | 5 refills | Status: AC

## 2023-02-07 MED ORDER — METHYLPREDNISOLONE ACETATE 40 MG/ML IJ SUSP
40.0000 mg | Freq: Once | INTRAMUSCULAR | Status: AC
  Administered 2023-02-07: 40 mg via INTRA_ARTICULAR
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
## 2023-02-21 ENCOUNTER — Ambulatory Visit: Payer: No Typology Code available for payment source | Admitting: Orthopaedic Surgery

## 2023-02-22 ENCOUNTER — Ambulatory Visit: Payer: No Typology Code available for payment source | Admitting: Orthopaedic Surgery

## 2023-02-22 ENCOUNTER — Encounter: Payer: Self-pay | Admitting: Orthopaedic Surgery

## 2023-02-22 VITALS — BP 156/92 | HR 85 | Ht 65.0 in | Wt 271.0 lb

## 2023-02-22 DIAGNOSIS — M25561 Pain in right knee: Secondary | ICD-10-CM

## 2023-02-22 DIAGNOSIS — G8929 Other chronic pain: Secondary | ICD-10-CM | POA: Diagnosis not present

## 2023-02-22 DIAGNOSIS — Z6841 Body Mass Index (BMI) 40.0 and over, adult: Secondary | ICD-10-CM | POA: Diagnosis not present

## 2023-02-22 NOTE — Progress Notes (Signed)
My knee is much better.  Her right knee is much improved.  She has little pain.  The injection helped.  She has taken the Naprosyn only slightly.  Gait is normal.  ROM of the right knee is full.  She has slight crepitus, no effusion, NV intact.  Encounter Diagnoses  Name Primary?   Chronic pain of right knee Yes   Body mass index 45.0-49.9, adult (HCC)    Morbid obesity (HCC)    Return in six weeks.  Call if any problem.  Precautions discussed.  Electronically Signed Darreld Mclean, MD 12/12/20249:13 AM

## 2023-04-04 ENCOUNTER — Other Ambulatory Visit (INDEPENDENT_AMBULATORY_CARE_PROVIDER_SITE_OTHER): Payer: No Typology Code available for payment source

## 2023-04-04 ENCOUNTER — Encounter: Payer: Self-pay | Admitting: Orthopaedic Surgery

## 2023-04-04 ENCOUNTER — Ambulatory Visit: Payer: No Typology Code available for payment source | Admitting: Orthopaedic Surgery

## 2023-04-04 VITALS — BP 143/80 | HR 67 | Ht 65.0 in

## 2023-04-04 DIAGNOSIS — M25571 Pain in right ankle and joints of right foot: Secondary | ICD-10-CM

## 2023-04-04 DIAGNOSIS — M2141 Flat foot [pes planus] (acquired), right foot: Secondary | ICD-10-CM

## 2023-04-04 NOTE — Progress Notes (Signed)
My knee is better but my foot and ankle hurt.  Her right knee is not hurting much now.  She has pain of the right lateral foot and lateral ankle.  She denies any trauma.  She has swelling and pain when first standing on it.  She has tired ice and heat and Tylenol with little help.  ROM of the right ankle is full. She has slight edema laterally but not much pain over the anterior talofibular ligament. She has some tenderness of the right fifth metatarsal, more at the base.  NV intact.  She has a flat foot.  X-rays were done of the right foot and ankle, reported separately.  Encounter Diagnoses  Name Primary?   Pain in right ankle and joints of right foot Yes   Pes planus of right foot    I have recommended arch supports.  Return prn.  Call if any problem.  Precautions discussed.  Electronically Signed Darreld Mclean, MD 1/22/20259:26 AM

## 2023-04-05 ENCOUNTER — Ambulatory Visit: Payer: No Typology Code available for payment source | Admitting: Orthopaedic Surgery

## 2023-11-02 ENCOUNTER — Encounter: Payer: Self-pay | Admitting: Radiology

## 2024-01-14 ENCOUNTER — Encounter: Payer: Self-pay | Admitting: Radiology
